# Patient Record
Sex: Male | Born: 1980 | Race: Black or African American | Hispanic: No | Marital: Married | Smoking: Never smoker
Health system: Southern US, Community
[De-identification: ages and names within clinical notes are randomized; demographics above are authoritative.]

## PROBLEM LIST (undated history)

## (undated) DIAGNOSIS — N200 Calculus of kidney: Secondary | ICD-10-CM

## (undated) HISTORY — PX: BACK SURGERY: SHX140

## (undated) HISTORY — PX: LITHOTRIPSY: SUR834

---

## 2005-03-14 ENCOUNTER — Emergency Department (HOSPITAL_COMMUNITY): Admission: EM | Admit: 2005-03-14 | Discharge: 2005-03-14 | Payer: Self-pay | Admitting: Emergency Medicine

## 2005-03-16 ENCOUNTER — Emergency Department (HOSPITAL_COMMUNITY): Admission: EM | Admit: 2005-03-16 | Discharge: 2005-03-16 | Payer: Self-pay | Admitting: Emergency Medicine

## 2008-08-25 ENCOUNTER — Emergency Department (HOSPITAL_BASED_OUTPATIENT_CLINIC_OR_DEPARTMENT_OTHER): Admission: EM | Admit: 2008-08-25 | Discharge: 2008-08-25 | Payer: Self-pay | Admitting: Emergency Medicine

## 2010-08-09 ENCOUNTER — Emergency Department (HOSPITAL_COMMUNITY): Admission: EM | Admit: 2010-08-09 | Discharge: 2010-08-09 | Payer: Self-pay | Admitting: Family Medicine

## 2010-08-12 ENCOUNTER — Emergency Department (HOSPITAL_COMMUNITY): Admission: EM | Admit: 2010-08-12 | Discharge: 2010-08-12 | Payer: Self-pay | Admitting: Emergency Medicine

## 2010-08-20 ENCOUNTER — Emergency Department (HOSPITAL_COMMUNITY): Admission: EM | Admit: 2010-08-20 | Discharge: 2010-08-20 | Payer: Self-pay | Admitting: Family Medicine

## 2010-11-24 ENCOUNTER — Encounter: Payer: Self-pay | Admitting: Urology

## 2011-01-16 LAB — CULTURE, ROUTINE-ABSCESS

## 2015-07-27 ENCOUNTER — Emergency Department (HOSPITAL_BASED_OUTPATIENT_CLINIC_OR_DEPARTMENT_OTHER): Payer: Self-pay

## 2015-07-27 ENCOUNTER — Encounter (HOSPITAL_BASED_OUTPATIENT_CLINIC_OR_DEPARTMENT_OTHER): Payer: Self-pay | Admitting: *Deleted

## 2015-07-27 ENCOUNTER — Emergency Department (HOSPITAL_BASED_OUTPATIENT_CLINIC_OR_DEPARTMENT_OTHER)
Admission: EM | Admit: 2015-07-27 | Discharge: 2015-07-27 | Disposition: A | Discharge status: Home / Self Care | Payer: Self-pay | Attending: Emergency Medicine | Admitting: Emergency Medicine

## 2015-07-27 DIAGNOSIS — R52 Pain, unspecified: Secondary | ICD-10-CM | POA: Insufficient documentation

## 2015-07-27 DIAGNOSIS — R05 Cough: Secondary | ICD-10-CM | POA: Insufficient documentation

## 2015-07-27 DIAGNOSIS — R69 Illness, unspecified: Secondary | ICD-10-CM

## 2015-07-27 DIAGNOSIS — J111 Influenza due to unidentified influenza virus with other respiratory manifestations: Secondary | ICD-10-CM

## 2015-07-27 DIAGNOSIS — J349 Unspecified disorder of nose and nasal sinuses: Secondary | ICD-10-CM | POA: Insufficient documentation

## 2015-07-27 DIAGNOSIS — Z87442 Personal history of urinary calculi: Secondary | ICD-10-CM | POA: Insufficient documentation

## 2015-07-27 HISTORY — DX: Calculus of kidney: N20.0

## 2015-07-27 LAB — BASIC METABOLIC PANEL
ANION GAP: 7 (ref 5–15)
BUN: 8 mg/dL (ref 6–20)
CHLORIDE: 103 mmol/L (ref 101–111)
CO2: 28 mmol/L (ref 22–32)
Calcium: 9.4 mg/dL (ref 8.9–10.3)
Creatinine, Ser: 1.01 mg/dL (ref 0.61–1.24)
GFR calc Af Amer: >60 mL/min (ref >60)
GLUCOSE: 92 mg/dL (ref 65–99)
POTASSIUM: 5.8 mmol/L — AB (ref 3.5–5.1)
SODIUM: 138 mmol/L (ref 135–145)

## 2015-07-27 LAB — CBC WITH DIFFERENTIAL/PLATELET
BASOS ABS: 0 10*3/uL (ref 0.0–0.1)
Basophils Relative: 0 %
Eosinophils Absolute: 0.1 10*3/uL (ref 0.0–0.7)
Eosinophils Relative: 1 %
HCT: 45.7 % (ref 39.0–52.0)
HEMOGLOBIN: 15.3 g/dL (ref 13.0–17.0)
LYMPHS ABS: 1.2 10*3/uL (ref 0.7–4.0)
LYMPHS PCT: 13 %
MCH: 30.4 pg (ref 26.0–34.0)
MCHC: 33.5 g/dL (ref 30.0–36.0)
MCV: 90.7 fL (ref 78.0–100.0)
Monocytes Absolute: 1.3 10*3/uL — ABNORMAL HIGH (ref 0.1–1.0)
Monocytes Relative: 14 %
NEUTROS PCT: 72 %
Neutro Abs: 6.7 10*3/uL (ref 1.7–7.7)
PLATELETS: 211 10*3/uL (ref 150–400)
RBC: 5.04 MIL/uL (ref 4.22–5.81)
RDW: 12.7 % (ref 11.5–15.5)
WBC: 9.4 10*3/uL (ref 4.0–10.5)

## 2015-07-27 LAB — I-STAT CG4 LACTIC ACID, ED: LACTIC ACID, VENOUS: 1.84 mmol/L (ref 0.5–2.0)

## 2015-07-27 LAB — INFLUENZA PANEL BY PCR (TYPE A & B)
H1N1FLUPCR: NOT DETECTED
INFLBPCR: NEGATIVE
Influenza A By PCR: NEGATIVE

## 2015-07-27 MED ORDER — ACETAMINOPHEN 325 MG PO TABS
650.0000 mg | ORAL_TABLET | Freq: Once | ORAL | Status: AC
  Administered 2015-07-27: 650 mg via ORAL
  Filled 2015-07-27: qty 2

## 2015-07-27 MED ORDER — HYDROCODONE-ACETAMINOPHEN 5-325 MG PO TABS
1.0000 | ORAL_TABLET | Freq: Once | ORAL | Status: AC
  Administered 2015-07-27: 1 via ORAL
  Filled 2015-07-27: qty 1

## 2015-07-27 MED ORDER — OSELTAMIVIR PHOSPHATE 75 MG PO CAPS
75.0000 mg | ORAL_CAPSULE | Freq: Once | ORAL | Status: AC
  Administered 2015-07-27: 75 mg via ORAL
  Filled 2015-07-27: qty 1

## 2015-07-27 MED ORDER — HYDROCODONE-ACETAMINOPHEN 5-325 MG PO TABS: ORAL_TABLET | ORAL | Status: DC

## 2015-07-27 MED ORDER — SODIUM CHLORIDE 0.9 % IV BOLUS (SEPSIS)
1000.0000 mL | Freq: Once | INTRAVENOUS | Status: AC
  Administered 2015-07-27: 1000 mL via INTRAVENOUS

## 2015-07-27 MED ORDER — IPRATROPIUM-ALBUTEROL 0.5-2.5 (3) MG/3ML IN SOLN
3.0000 mL | Freq: Four times a day (QID) | RESPIRATORY_TRACT | Status: DC
  Administered 2015-07-27: 3 mL via RESPIRATORY_TRACT
  Filled 2015-07-27: qty 3

## 2015-07-27 MED ORDER — PROMETHAZINE HCL 25 MG PO TABS: 25.0000 mg | ORAL_TABLET | Freq: Four times a day (QID) | ORAL | Status: DC | PRN

## 2015-07-27 MED ORDER — OSELTAMIVIR PHOSPHATE 75 MG PO CAPS: 75.0000 mg | ORAL_CAPSULE | Freq: Two times a day (BID) | ORAL | Status: DC

## 2015-07-27 NOTE — ED Notes (Signed)
Pt. Stated he has had chills and body aches for approx. 3 days.

## 2015-07-27 NOTE — Discharge Instructions (Signed)
Return to the emergency room for any worsening or concerning symptoms including fast breathing, heart racing, confusion, vomiting.  Rest, cover your mouth when you cough and wash your hands frequently.   Push fluids: water or Gatorade, do not drink any soda, juice or caffeinated beverages.  For fever and pain control you can take Motrin (ibuprofen) as follows: 400 mg (this is normally 2 over the counter pills) every 4 hours with food.  Do not return to work until a day after your fever breaks.   Take Vicodin for cough and pain control, do not drink alcohol, drive, care for children or do other critical tasks while taking Vicodin     Do not hesitate to return to the emergency room for any new, worsening or concerning symptoms.  Please obtain primary care using resource guide below. Let them know that you were seen in the emergency room and that they will need to obtain records for further outpatient management.    Emergency Department Resource Guide 1) Find a Doctor and Pay Out of Pocket Although you won't have to find out who is covered by your insurance plan, it is a good idea to ask around and get recommendations. You will then need to call the office and see if the doctor you have chosen will accept you as a new patient and what types of options they offer for patients who are self-pay. Some doctors offer discounts or will set up payment plans for their patients who do not have insurance, but you will need to ask so you aren't surprised when you get to your appointment.  2) Contact Your Local Health Department Not all health departments have doctors that can see patients for sick visits, but many do, so it is worth a call to see if yours does. If you don't know where your local health department is, you can check in your phone book. The CDC also has a tool to help you locate your state's health department, and many state websites also have listings of all of their local health  departments.  3) Find a Walk-in Clinic If your illness is not likely to be very severe or complicated, you may want to try a walk in clinic. These are popping up all over the country in pharmacies, drugstores, and shopping centers. They're usually staffed by nurse practitioners or physician assistants that have been trained to treat common illnesses and complaints. They're usually fairly quick and inexpensive. However, if you have serious medical issues or chronic medical problems, these are probably not your best option.  No Primary Care Doctor: - Call Health Connect at  [phone] - they can help you locate a primary care doctor that  accepts your insurance, provides certain services, etc. - Physician Referral Service- [phone]  Chronic Pain Problems: Organization         Address  Phone   Notes  [facility]  [phone] Patients need to be referred by their primary care doctor.   Medication Assistance: Organization         Address  Phone   Notes  [facility] [address] [place], [place] [address] [phone] --Must be a resident of [facility] -- Must have NO insurance coverage whatsoever (no Medicaid/ Medicare, etc.) -- The pt. MUST have a primary care doctor that directs their care regularly and follows them in the community   [facility]  [phone]   [address]  [phone]  Agencies that provide inexpensive medical care: °Organization         Address  Phone   Notes  °[facility]  (336) [phone]   °[facility]    (336) 832-7272   °[facility] [address] °[place], [place] [address] (336) 832-4777   °[facility] [address], °[place] (336) 271-4999   °[facility]    (336) 373-0678   °[facility]    (336) 272-1050   °[facility] ° [address], [place] Phone:  (336)  [phone], Fax:  (336) [phone] Hours of Operation:  9 am - 6 pm, M-F.  Also accepts Medicaid/Medicare and self-pay.  °[facility] ° [address], [place] Phone: (336) [phone], Fax: (336) [phone]. Hours of Operation:  8:30 am - 5:30 pm, M-F.  Also accepts Medicaid and self-pay.  °[facility] [place] [address], [place] Phone: (336) 878-6027   °[facility] [address], [place], [place] (336)723-1848, Ext. 123 Mondays & Thursdays: 7-9 AM.  First 15 patients are seen on a first come, first serve basis. °  ° °Medicaid-accepting [place] Providers: ° °Organization         Address  Phone   Notes  °[facility] [address], [place] (336) 641-2100 Also accepts self-pay patients.  °[facility] [address], [place] ° (336) 856-9996   °[facility] [address], [place] (336) 288-8857   °[facility] [address] [place] [address], [place] (336) 299-7000   °Veita Bland [address], [place]  ° (336) 373-1557 Only accepts [place] Access Medicaid patients after they have their name applied to their card.  ° °Self-Pay (no insurance) in [place]: ° °Organization         Address  Phone   Notes  °Sickle Cell Patients, [facility] [address], [place] (336) 832-1970   °[facility] [address], [place] (336) 832-4400   °[facility] ° [address], [place] (336) 992-4800   °[facility]/Dr. Osei-Bonsu ° [address] [place] [address], [place] or [address], [place] (336) 841-8500 Phone number for both [place] and [place] locations is the same.  °[facility] [address], [place] (336) 299-0000   °[facility] [address] [place] [address], [place] or [address] (336)  852-7530 °(336) 878-2260   °[facility] [address], [place] (336) 350-1642, phone; (336) 294-5005, fax Sees patients 1st and 3rd Saturday of every month.  Must not qualify for public or private insurance (i.e. Medicaid, Medicare, [place] Health Choice, Veterans' Benefits) • Household income should be no more than 200% of the poverty level •The clinic cannot treat you if you are pregnant or think you are pregnant • Sexually transmitted diseases are not treated at the clinic.  ° ° °Dental Care: °Organization         Address  Phone  Notes  °[place] [facility] [address], [place] (336) 641-6152 Accepts children up to age 21 who are enrolled in Medicaid or [place] Health Choice; pregnant women with a Medicaid card; and children who have applied for Medicaid or   [place] Health Choice, but were declined, whose parents can pay a reduced fee at time of service.  °[place] [facility] [place]  [address], [place] (336) 641-7733 Accepts children up to age 21 who are enrolled in Medicaid or [place] Health Choice; pregnant women with a Medicaid card; and children who have applied for Medicaid or [place] Health Choice, but were declined, whose parents can pay a reduced fee at time of service.  °[facility] ° [address], [place] (336) 641-4533 Patients are seen by appointment only. Walk-ins are not accepted. [facility] will see patients 18 years of age and older. °Monday - Tuesday (8am-5pm) °Most Wednesdays (8:30-5pm) °$30 per visit, cash only  °[facility] ° [address], [place] (336) 641-4533 Patients are seen by appointment only. Walk-ins are not accepted. [facility] will see patients 18 years of age and older. °One Wednesday Evening (Monthly: Volunteer Based).  $30 per visit, cash only  °[facility]  (919) 537-3737 for adults;  Children under age 4, call Graduate Pediatric Dentistry at ([phone]) [phone]. Children aged 4-14, please call ([phone]) [phone] to request a pediatric application. ° Dental services are provided in all areas of dental care including fillings, crowns and bridges, complete and partial dentures, implants, gum treatment, root canals, and extractions. Preventive care is also provided. Treatment is provided to both adults and children. °Patients are selected via a lottery and there is often a waiting list. °  °[facility] [address], °[place] ° (336) 763-8833 [email or web address] °  °[facility] [address], [place], [place] (336)723-1848, Ext. 123 Second and Fourth Thursday of each month, opens at 6:30 AM; Clinic ends at 9 AM.  Patients are seen on a first-come first-served basis, and a limited number are seen during each clinic.  ° °[facility] ° [address], [place], [place] (336) 723-7904   Eligibility Requirements °You must have lived in [place], [place], or [place] counties for at least the last three months. °  You cannot be eligible for state or federal sponsored healthcare insurance, including Veterans Administration, Medicaid, or Medicare. °  You generally cannot be eligible for healthcare insurance through your employer.  °  How to apply: °Eligibility screenings are held every Tuesday and Wednesday afternoon from 1:00 pm until 4:00 pm. You do not need an appointment for the interview!  °[facility] [address], [place], [place] 336-631-2330   °[facility]  336-342-8273   °[facility]  336-703-3100   °[facility]  336-570-6415   ° °Behavioral Health Resources in the Community: °Intensive Outpatient Programs °Organization         Address  Phone  Notes  °[facility] [address], [place], [place] 336-878-6098   °[facility] [address], [place], [place] 336-832-9800   °[facility] [address], [place], [place] ° 336-882-2125   °[facility] [address],  °[place], [place] 1-800-853-5163 or 336-641-4981   °Substance Abuse Resources °Organization         Address  Phone  Notes  °[facility]  336-882-2125   °[facility]  336-784-9470   °[facility]  336-285-9073   °[facility]  336-845-3988   °Residential & Outpatient Substance Abuse Program  1-800-659-3381   °  Psychological Services °Organization         Address  Phone  Notes  °[facility]  336- 832-9600   °[facility]  336- 378-7881   °[facility] [address], [place] 1-800-853-5163 or 336-641-4981   ° °Mobile Crisis Teams °Organization         Address  Phone  Notes  °[facility], Mobile Crisis Care Unit  1-877-626-1772   °Assertive °[facility] ° [address] [place], [place] 336-834-9664   °Sharon DeEsch [address] °[place] 336-554-5454   ° °Self-Help/Support Groups °Organization         Address  Phone             Notes  °[facility] [place] - variety of support groups  336- 373-1402 Call for more information  °Narcotics Anonymous (NA), [facility] [address], °[place]  2 meetings at this location  ° °Residential Treatment Programs °Organization         Address  Phone  Notes  °[facility] [address],    °[place]  1-866-801-8205   °[facility] ° [address], [place], [place] 704-293-8524   °[facility] [address], [place] 336-845-3988 Admissions: 8am-3pm M-F  °[facility] [address]    °[place], [place] 336-841-1104   °[facility] [address], [place], [place] 336-379-7146   °[facility] [address]  °[place], [place] 336-285-9073   °[facility] - Intensive  Outpatient [address], [place], [place] 336-852-3033   °[facility] [address]  °[place], [place] 1-877-615-2722 or 336-784-9470   °[facility] [address] [place], [place] 336-227-7417 Accepts Medicaid  °[facility] [address]  ° [place] 1-800-659-3381 Substance Abuse/Addiction Treatment  ° °[place] Behavioral Health Resources °Organization         Address  Phone  Notes  °[facility]  (888) 581-9988   °Julie Brannon, PhD [address] [place], [place]   (336) 349-5553 or (336) 951-0000   °[facility]   [address] °[place], [place] (336) 349-4454   °[facility] [address], [place], [place] (336) 342-8316 Insurance/Medicaid/sponsorship through [facility]  °[facility] [address]                                    [place], [place] (336) 342-8316 Therapy/tele-psych/case  °[facility] [address]  ° [place], [place] (336) 349-2233    °Dr. Arfeen  (336) 349-4544   °[facility] [place]  [employer] [place] [facility] 1) [address], [place] °2) [address], [place] °3)  [address], [place] (336) 349-3220 °(336) 342-7768 ° °(336) 342-8140   °[place] [facility] (336) 342-1394 or (336) 342-3537 (After Hours)    ° ° ° °

## 2015-07-27 NOTE — ED Notes (Signed)
Cough, runny nose, aching all over, headache, ear pain and sore throat x 3 days.

## 2015-07-27 NOTE — ED Provider Notes (Signed)
CSN: [id number]     Arrival date & time 07/27/15  1850 History  This chart was scribed for non-physician practitioner, Wynetta Emery, PA working with Cathren Laine, MD by Angelene Giovanni, ED Scribe. The patient was seen in room MH11/MH11 and the patient's care was started at 7:11 PM     Chief Complaint  Patient presents with  . URI   The history is provided by the patient. No language interpreter was used.   HPI Comments: Henry Fields is a 34 y.o. male who presents to the Emergency Department complaining of URI onset 3 days ago. He reports associated generalized aches, productive cough, rhinorrhea with thick green sputum, bilateral ear pain, sore throat, and a HA. He denies any CP but adds that when he lays down, he has chest pressure and SOB. He also denies any N/V/D. He reports that his highest fever was 100.3. He states that he alternated Advil and Benadryl with no relief. He denies getting his flu vaccine this season. He denies being a current smoker.  No PCP.   Past Medical History  Diagnosis Date  . Kidney stones    Past Surgical History  Procedure Laterality Date  . Lithotripsy    . Back surgery     No family history on file. Social History  Substance Use Topics  . Smoking status: Never Smoker   . Smokeless tobacco: None  . Alcohol Use: No    Review of Systems A complete 10 system review of systems was obtained and all systems are negative except as noted in the HPI and PMH.     Allergies  Review of patient's allergies indicates no known allergies.  Home Medications   Prior to Admission medications   Not on File   BP 105/72 mmHg  Pulse 103  Temp(Src) 100.3 F (37.9 C) (Oral)  Resp 18  Ht  (1.778 m)  Wt 120 lb (54.432 kg)  BMI 17.22 kg/m2  SpO2 97% Physical Exam  Constitutional: He is oriented to person, place, and time. He appears well-developed and well-nourished. No distress.  HENT:  Head: Normocephalic.  Mouth/Throat: Oropharynx is clear  and moist.  Positive rhinorrhea  Eyes: Conjunctivae and EOM are normal. Pupils are equal, round, and reactive to light. No scleral icterus.  Neck: Normal range of motion.  Cardiovascular: Normal rate, regular rhythm and intact distal pulses.   Pulmonary/Chest: Effort normal and breath sounds normal. No stridor. No respiratory distress. He has no wheezes. He has no rales. He exhibits no tenderness.  Abdominal: Soft. Bowel sounds are normal. He exhibits no distension and no mass. There is no tenderness. There is no rebound and no guarding.  Musculoskeletal: Normal range of motion.  Neurological: He is alert and oriented to person, place, and time.  Psychiatric: He has a normal mood and affect.  Nursing note and vitals reviewed.   ED Course  Procedures (including critical care time) DIAGNOSTIC STUDIES: Oxygen Saturation is 97% on RA, adequate by my interpretation.    COORDINATION OF CARE: 7:16 PM- Pt advised of plan for treatment and pt agrees.      Labs Review Labs Reviewed - No data to display  Imaging Review No results found. Wynetta Emery, PA has personally reviewed and evaluated these images and lab results as part of her medical decision-making.   EKG Interpretation None      MDM   Final diagnoses:  Influenza-like illness    Filed Vitals:   07/27/15 1901 07/27/15 2020  BP: 105/72  Pulse: 103   Temp: 100.3 F (37.9 C)   TempSrc: Oral   Resp: 18   Height:  (1.778 m)   Weight: 120 lb (54.432 kg)   SpO2: 97% 100%    Medications  acetaminophen (TYLENOL) tablet 650 mg (not administered)  ipratropium-albuterol (DUONEB) 0.5-2.5 (3) MG/3ML nebulizer solution 3 mL (not administered)  HYDROcodone-acetaminophen (NORCO/VICODIN) 5-325 MG per tablet 1 tablet (not administered)  sodium chloride 0.9 % bolus 1,000 mL (not administered)    Henry Fields is a pleasant 34 y.o. male presenting with cough, rhinorrhea, sore throat and diffuse myalgia. Low-grade  temperature and mild tachycardia. Chest x-ray without infiltrate. Patient reports shortness of breath, will give nebulizer treatment however, his lung sounds are clear with excellent air movement. Patient has no leukocytosis, normal lactic acid. Pending influenza testing.  I have been informed the influenza testing will not resolve tonight. Will treat clinically.  Evaluation does not show pathology that would require ongoing emergent intervention or inpatient treatment. Pt is hemodynamically stable and mentating appropriately. Discussed findings and plan with patient/guardian, who agrees with care plan. All questions answered. Return precautions discussed and outpatient follow up given.   New Prescriptions   HYDROCODONE-ACETAMINOPHEN (NORCO/VICODIN) 5-325 MG PER TABLET    Take 1-2 tablets by mouth every 6 hours as needed for pain and/or cough.   OSELTAMIVIR (TAMIFLU) 75 MG CAPSULE    Take 1 capsule (75 mg total) by mouth every 12 (twelve) hours.   PROMETHAZINE (PHENERGAN) 25 MG TABLET    Take 1 tablet (25 mg total) by mouth every 6 (six) hours as needed for nausea or vomiting.     I personally performed the services described in this documentation, which was scribed in my presence. The recorded information has been reviewed and is accurate.   Wynetta Emery, PA-C 07/27/15 2222  Cathren Laine, MD 07/27/15 [phone]

## 2016-04-02 ENCOUNTER — Emergency Department (HOSPITAL_BASED_OUTPATIENT_CLINIC_OR_DEPARTMENT_OTHER)
Admission: EM | Admit: 2016-04-02 | Discharge: 2016-04-03 | Disposition: A | Discharge status: Home / Self Care | Payer: BLUE CROSS/BLUE SHIELD | Attending: Emergency Medicine | Admitting: Emergency Medicine

## 2016-04-02 ENCOUNTER — Emergency Department (HOSPITAL_BASED_OUTPATIENT_CLINIC_OR_DEPARTMENT_OTHER): Payer: BLUE CROSS/BLUE SHIELD

## 2016-04-02 ENCOUNTER — Encounter (HOSPITAL_BASED_OUTPATIENT_CLINIC_OR_DEPARTMENT_OTHER): Payer: Self-pay | Admitting: *Deleted

## 2016-04-02 DIAGNOSIS — R0789 Other chest pain: Secondary | ICD-10-CM

## 2016-04-02 DIAGNOSIS — R072 Precordial pain: Secondary | ICD-10-CM | POA: Insufficient documentation

## 2016-04-02 LAB — CBC
HCT: 44.9 % (ref 39.0–52.0)
Hemoglobin: 15 g/dL (ref 13.0–17.0)
MCH: 30.7 pg (ref 26.0–34.0)
MCHC: 33.4 g/dL (ref 30.0–36.0)
MCV: 92 fL (ref 78.0–100.0)
PLATELETS: 243 10*3/uL (ref 150–400)
RBC: 4.88 MIL/uL (ref 4.22–5.81)
RDW: 13 % (ref 11.5–15.5)
WBC: 7 10*3/uL (ref 4.0–10.5)

## 2016-04-02 LAB — COMPREHENSIVE METABOLIC PANEL
ALT: 20 U/L (ref 17–63)
AST: 30 U/L (ref 15–41)
Albumin: 4.2 g/dL (ref 3.5–5.0)
Alkaline Phosphatase: 47 U/L (ref 38–126)
Anion gap: 5 (ref 5–15)
BUN: 18 mg/dL (ref 6–20)
CHLORIDE: 103 mmol/L (ref 101–111)
CO2: 30 mmol/L (ref 22–32)
CREATININE: 0.85 mg/dL (ref 0.61–1.24)
Calcium: 9.9 mg/dL (ref 8.9–10.3)
GFR calc non Af Amer: >60 mL/min (ref >60)
Glucose, Bld: 87 mg/dL (ref 65–99)
POTASSIUM: 3.9 mmol/L (ref 3.5–5.1)
SODIUM: 138 mmol/L (ref 135–145)
Total Bilirubin: 1.1 mg/dL (ref 0.3–1.2)
Total Protein: 7.2 g/dL (ref 6.5–8.1)

## 2016-04-02 LAB — TROPONIN I

## 2016-04-02 MED ORDER — IBUPROFEN 400 MG PO TABS
400.0000 mg | ORAL_TABLET | Freq: Once | ORAL | Status: AC
  Administered 2016-04-02: 400 mg via ORAL
  Filled 2016-04-02: qty 1

## 2016-04-02 MED ORDER — HYDROCODONE-ACETAMINOPHEN 5-325 MG PO TABS
2.0000 | ORAL_TABLET | Freq: Once | ORAL | Status: AC
  Administered 2016-04-02: 2 via ORAL
  Filled 2016-04-02: qty 2

## 2016-04-02 NOTE — ED Notes (Signed)
Patient transported to X-ray 

## 2016-04-02 NOTE — ED Notes (Signed)
MD at bedside to update pt on plan of care.

## 2016-04-02 NOTE — Discharge Instructions (Signed)
It was our pleasure to provide your ER care today - we hope that you feel better.  Take motrin or aleve as need for pain.  Follow up with primary care doctor in the next few days if symptoms fail to improve/resolve.  Return to ER if worse, new symptoms, trouble breathing, persistent/recurrent chest pain, other concern.     Chest Wall Pain Chest wall pain is pain in or around the bones and muscles of your chest. Sometimes, an injury causes this pain. Sometimes, the cause may not be known. This pain may take several weeks or longer to get better. HOME CARE INSTRUCTIONS  Pay attention to any changes in your symptoms. Take these actions to help with your pain:   Rest as told by your health care provider.   Avoid activities that cause pain. These include any activities that use your chest muscles or your abdominal and side muscles to lift heavy items.   If directed, apply ice to the painful area:  Put ice in a plastic bag.  Place a towel between your skin and the bag.  Leave the ice on for 20 minutes, 2-3 times per day.  Take over-the-counter and prescription medicines only as told by your health care provider.  Do not use tobacco products, including cigarettes, chewing tobacco, and e-cigarettes. If you need help quitting, ask your health care provider.  Keep all follow-up visits as told by your health care provider. This is important. SEEK MEDICAL CARE IF:  You have a fever.  Your chest pain becomes worse.  You have new symptoms. SEEK IMMEDIATE MEDICAL CARE IF:  You have nausea or vomiting.  You feel sweaty or light-headed.  You have a cough with phlegm (sputum) or you cough up blood.  You develop shortness of breath.   This information is not intended to replace advice given to you by your health care provider. Make sure you discuss any questions you have with your health care provider.   Document Released: 10/20/2005 Document Revised: 07/11/2015 Document Reviewed:  01/15/2015 Elsevier Interactive Patient Education 2016 Else[employer] Inc.    Nonspecific Chest Pain  Chest pain can be caused by many different conditions. There is always a chance that your pain could be related to something serious, such as a heart attack or a blood clot in your lungs. Chest pain can also be caused by conditions that are not life-threatening. If you have chest pain, it is very important to follow up with your health care provider. CAUSES  Chest pain can be caused by:  Heartburn.  Pneumonia or bronchitis.  Anxiety or stress.  Inflammation around your heart (pericarditis) or lung (pleuritis or pleurisy).  A blood clot in your lung.  A collapsed lung (pneumothorax). It can develop suddenly on its own (spontaneous pneumothorax) or from trauma to the chest.  Shingles infection (varicella-zoster virus).  Heart attack.  Damage to the bones, muscles, and cartilage that make up your chest wall. This can include:  Bruised bones due to injury.  Strained muscles or cartilage due to frequent or repeated coughing or overwork.  Fracture to one or more ribs.  Sore cartilage due to inflammation (costochondritis). RISK FACTORS  Risk factors for chest pain may include:  Activities that increase your risk for trauma or injury to your chest.  Respiratory infections or conditions that cause frequent coughing.  Medical conditions or overeating that can cause heartburn.  Heart disease or family history of heart disease.  Conditions or health behaviors that increase your risk  of developing a blood clot.  Having had chicken pox (varicella zoster). SIGNS AND SYMPTOMS Chest pain can feel like:  Burning or tingling on the surface of your chest or deep in your chest.  Crushing, pressure, aching, or squeezing pain.  Dull or sharp pain that is worse when you move, cough, or take a deep breath.  Pain that is also felt in your back, neck, shoulder, or arm, or pain that spreads  to any of these areas. Your chest pain may come and go, or it may stay constant. DIAGNOSIS Lab tests or other studies may be needed to find the cause of your pain. Your health care provider may have you take a test called an ambulatory ECG (electrocardiogram). An ECG records your heartbeat patterns at the time the test is performed. You may also have other tests, such as:  Transthoracic echocardiogram (TTE). During echocardiography, sound waves are used to create a picture of all of the heart structures and to look at how blood flows through your heart.  Transesophageal echocardiogram (TEE).This is a more advanced imaging test that obtains images from inside your body. It allows your health care provider to see your heart in finer detail.  Cardiac monitoring. This allows your health care provider to monitor your heart rate and rhythm in real time.  Holter monitor. This is a portable device that records your heartbeat and can help to diagnose abnormal heartbeats. It allows your health care provider to track your heart activity for several days, if needed.  Stress tests. These can be done through exercise or by taking medicine that makes your heart beat more quickly.  Blood tests.  Imaging tests. TREATMENT  Your treatment depends on what is causing your chest pain. Treatment may include:  Medicines. These may include:  Acid blockers for heartburn.  Anti-inflammatory medicine.  Pain medicine for inflammatory conditions.  Antibiotic medicine, if an infection is present.  Medicines to dissolve blood clots.  Medicines to treat coronary artery disease.  Supportive care for conditions that do not require medicines. This may include:  Resting.  Applying heat or cold packs to injured areas.  Limiting activities until pain decreases. HOME CARE INSTRUCTIONS  If you were prescribed an antibiotic medicine, finish it all even if you start to feel better.  Avoid any activities that  bring on chest pain.  Do not use any tobacco products, including cigarettes, chewing tobacco, or electronic cigarettes. If you need help quitting, ask your health care provider.  Do not drink alcohol.  Take medicines only as directed by your health care provider.  Keep all follow-up visits as directed by your health care provider. This is important. This includes any further testing if your chest pain does not go away.  If heartburn is the cause for your chest pain, you may be told to keep your head raised (elevated) while sleeping. This reduces the chance that acid will go from your stomach into your esophagus.  Make lifestyle changes as directed by your health care provider. These may include:  Getting regular exercise. Ask your health care provider to suggest some activities that are safe for you.  Eating a heart-healthy diet. A registered dietitian can help you to learn healthy eating options.  Maintaining a healthy weight.  Managing diabetes, if necessary.  Reducing stress. SEEK MEDICAL CARE IF:  Your chest pain does not go away after treatment.  You have a rash with blisters on your chest.  You have a fever. SEEK IMMEDIATE MEDICAL CARE  IF:   Your chest pain is worse.  You have an increasing cough, or you cough up blood.  You have severe abdominal pain.  You have severe weakness.  You faint.  You have chills.  You have sudden, unexplained chest discomfort.  You have sudden, unexplained discomfort in your arms, back, neck, or jaw.  You have shortness of breath at any time.  You suddenly start to sweat, or your skin gets clammy.  You feel nauseous or you vomit.  You suddenly feel light-headed or dizzy.  Your heart begins to beat quickly, or it feels like it is skipping beats. These symptoms may represent a serious problem that is an emergency. Do not wait to see if the symptoms will go away. Get medical help right away. Call your local emergency services  (911 in the [place] Do not drive yourself to the hospital.   This information is not intended to replace advice given to you by your health care provider. Make sure you discuss any questions you have with your health care provider.   Document Released: 07/30/2005 Document Revised: 11/10/2014 Document Reviewed: 05/26/2014 Elsevier Interactive Patient Education [employer]2016 Elsevier Inc.

## 2016-04-02 NOTE — ED Provider Notes (Signed)
CSN: [id number]     Arrival date & time 04/02/16  1959 History   By signing my name below, I, Jasmyn B. Alexander, attest that this documentation has been prepared under the direction and in the presence of Cathren LaineKevin Raissa Dam, MD.  Electronically Signed: Gillis EndsJasmyn B. Lyn HollingsheadAlexander, ED Scribe. 04/02/2016. 9:58 PM.  Chief Complaint  Patient presents with  . Chest Pain   The history is provided by the patient. No language interpreter was used.   HPI Comments: Gwyndolyn Saxonimothy J Fines is a 35 y.o. male who presents to the Emergency Department complaining of sudden onset, intermittent, central chest pain onset x 9 hours ago. 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 He denies any heavy lifting when pain occurred. The pain is exacerbated with movement. Pt reports he took a baby aspirin earlier PTA with mild relief. He does not have any cardiac PMHx but does have a FHx of cardiac issues in his grandfather, but no family hx premature cad.   Denies any abdominal pain, cough, fever. Pt is non smoker, no drug use. No pleuritic pain or lateralizing pain. No leg pain or swelling. No hx dvt or pe.  No recent surgery, immobility, travel or trauma.       Past Medical History  Diagnosis Date  . Kidney stones    Past Surgical History  Procedure Laterality Date  . Lithotripsy    . Back surgery     History reviewed. No pertinent family history. Social History  Substance Use Topics  . Smoking status: Never Smoker   . Smokeless tobacco: None  . Alcohol Use: No    Review of Systems  Constitutional: Negative for fever.  HENT: Negative for sore throat.   Respiratory: Negative for cough and shortness of breath.   Cardiovascular: Positive for chest pain. Negative for palpitations and leg swelling.  Gastrointestinal: Negative for nausea, vomiting and abdominal pain.  Genitourinary: Negative for flank pain.  Musculoskeletal: Negative for back pain and neck pain.   Neurological: Negative for headaches.  All other systems reviewed and are negative.  Allergies  Review of patient's allergies indicates no known allergies.  Home Medications   Prior to Admission medications   Not on File   BP 104/81 mmHg  Pulse 56  Temp(Src) 99 F (37.2 C)  Resp 17  Ht 5\' 10"  (1.778 m)  Wt 120 lb (54.432 kg)  BMI 17.22 kg/m2  SpO2 100% Physical Exam  Constitutional: He is oriented to person, place, and time. He appears well-developed and well-nourished. No distress.  HENT:  Head: Atraumatic.  Mouth/Throat: Oropharynx is clear and moist.  Eyes: Conjunctivae are normal. No scleral icterus.  Neck: Neck supple. No tracheal deviation present.  Cardiovascular: Normal rate, regular rhythm, normal heart sounds and intact distal pulses.  Exam reveals no gallop and no friction rub.   No murmur heard. Pulmonary/Chest: Effort normal and breath sounds normal. No respiratory distress. He has no wheezes. He has no rales. He exhibits tenderness.  Chest wall tenderness, reproducing symptoms.   Abdominal: Soft. Bowel sounds are normal. He exhibits no distension. There is no tenderness. There is no rebound and no guarding.  Musculoskeletal: Normal range of motion. He exhibits no edema or tenderness.  Neurological: He is alert and oriented to person, place, and time.  Skin: Skin is warm and dry. No rash noted. He is not diaphoretic.  Psychiatric: He has a normal mood and affect.  Nursing  note and vitals reviewed.   ED Course  Procedures (including critical care time) DIAGNOSTIC STUDIES: Oxygen Saturation is 100% on RA, normal by my interpretation.    COORDINATION OF CARE: 9:07 PM-Discussed treatment plan which includes repeat troponin with pt at bedside and pt agreed to plan.    Labs Review  Results for orders placed or performed during the hospital encounter of 04/02/16  CBC  Result Value Ref Range   WBC 7.0 4.0 - 10.5 K/uL   RBC 4.88 4.22 - 5.81 MIL/uL    Hemoglobin 15.0 13.0 - 17.0 g/dL   HCT 16.1 09.6 - 04.5 %   MCV 92.0 78.0 - 100.0 fL   MCH 30.7 26.0 - 34.0 pg   MCHC 33.4 30.0 - 36.0 g/dL   RDW 40.9 81.1 - 91.4 %   Platelets 243 150 - 400 K/uL  Comprehensive metabolic panel  Result Value Ref Range   Sodium 138 135 - 145 mmol/L   Potassium 3.9 3.5 - 5.1 mmol/L   Chloride 103 101 - 111 mmol/L   CO2 30 22 - 32 mmol/L   Glucose, Bld 87 65 - 99 mg/dL   BUN 18 6 - 20 mg/dL   Creatinine, Ser 7.82 0.61 - 1.24 mg/dL   Calcium 9.9 8.9 - 95.6 mg/dL   Total Protein 7.2 6.5 - 8.1 g/dL   Albumin 4.2 3.5 - 5.0 g/dL   AST 30 15 - 41 U/L   ALT 20 17 - 63 U/L   Alkaline Phosphatase 47 38 - 126 U/L   Total Bilirubin 1.1 0.3 - 1.2 mg/dL   GFR calc non Af Amer >60 >60 mL/min   GFR calc Af Amer >60 >60 mL/min   Anion gap 5 5 - 15  Troponin I  Result Value Ref Range   Troponin I <0.03 <0.031 ng/mL   Dg Chest 2 View  04/02/2016  CLINICAL DATA:  Left-sided chest pain with shortness of breath, fatigue EXAM: CHEST  2 VIEW COMPARISON:  07/27/2015 FINDINGS: The heart size and vascular pattern are normal. There is no consolidation or effusion. Spinal rods stable. IMPRESSION: No active cardiopulmonary disease. Electronically Signed   By: Esperanza Heir M.D.   On: 04/02/2016 21:19         I have personally reviewed and evaluated these images and lab results as part of my medical decision-making.   EKG Interpretation   Date/Time:  Wednesday Apr 02 2016 20:05:32 EDT Ventricular Rate:  75 PR Interval:  156 QRS Duration: 78 QT Interval:  340 QTC Calculation: 379 R Axis:   80 Text Interpretation:  Normal sinus rhythm with sinus arrhythmia Possible  Left atrial enlargement Early repolarization `prom t waves No previous  tracing Confirmed by Denton Lank  MD, Caryn Bee ([id number]) on 04/02/2016 8:13:55 PM      MDM   I personally performed the services described in this documentation, which was scribed in my presence. The recorded information has been  reviewed and considered. Cathren Laine, MD  Iv ns. Labs.  Cxr.  Patient w chest wall tenderness.  Motrin po.  Recheck, pt comfortable appearing.  2325 repeat troponin pending.  Signed out to Dr Read Drivers to check repeat troponin when back.  If repeat trop normal/not increasing, feel likely will be able to be d/c with outpatient follow up.      Cathren Laine, MD 04/02/16 2330

## 2016-04-02 NOTE — ED Notes (Signed)
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 Pt states the pain now comes and goes. Pt describes it as a "tight pain" and rates it a 9/10. Pt denies ShOB, nausea, or diaphoresis. Pain is reproducible to palpation and worse with movement.

## 2016-04-02 NOTE — ED Notes (Signed)
Pt c/o chest pain x 1 day SOB only

## 2016-11-12 ENCOUNTER — Encounter (HOSPITAL_BASED_OUTPATIENT_CLINIC_OR_DEPARTMENT_OTHER): Payer: Self-pay | Admitting: *Deleted

## 2016-11-12 ENCOUNTER — Emergency Department (HOSPITAL_BASED_OUTPATIENT_CLINIC_OR_DEPARTMENT_OTHER)
Admission: EM | Admit: 2016-11-12 | Discharge: 2016-11-12 | Disposition: A | Discharge status: Home / Self Care | Payer: BLUE CROSS/BLUE SHIELD | Attending: Emergency Medicine | Admitting: Emergency Medicine

## 2016-11-12 DIAGNOSIS — R69 Illness, unspecified: Secondary | ICD-10-CM

## 2016-11-12 DIAGNOSIS — B349 Viral infection, unspecified: Secondary | ICD-10-CM

## 2016-11-12 DIAGNOSIS — J111 Influenza due to unidentified influenza virus with other respiratory manifestations: Secondary | ICD-10-CM

## 2016-11-12 DIAGNOSIS — R509 Fever, unspecified: Secondary | ICD-10-CM | POA: Diagnosis present

## 2016-11-12 LAB — CBC WITH DIFFERENTIAL/PLATELET
BASOS ABS: 0 10*3/uL (ref 0.0–0.1)
BASOS PCT: 1 %
EOS ABS: 0.1 10*3/uL (ref 0.0–0.7)
Eosinophils Relative: 1 %
HEMATOCRIT: 42.6 % (ref 39.0–52.0)
HEMOGLOBIN: 14.2 g/dL (ref 13.0–17.0)
Lymphocytes Relative: 15 %
Lymphs Abs: 1 10*3/uL (ref 0.7–4.0)
MCH: 30.9 pg (ref 26.0–34.0)
MCHC: 33.3 g/dL (ref 30.0–36.0)
MCV: 92.8 fL (ref 78.0–100.0)
Monocytes Absolute: 1.3 10*3/uL — ABNORMAL HIGH (ref 0.1–1.0)
Monocytes Relative: 19 %
NEUTROS ABS: 4.4 10*3/uL (ref 1.7–7.7)
NEUTROS PCT: 64 %
Platelets: 225 10*3/uL (ref 150–400)
RBC: 4.59 MIL/uL (ref 4.22–5.81)
RDW: 12.8 % (ref 11.5–15.5)
WBC: 6.7 10*3/uL (ref 4.0–10.5)

## 2016-11-12 LAB — BASIC METABOLIC PANEL
ANION GAP: 5 (ref 5–15)
BUN: 7 mg/dL (ref 6–20)
CHLORIDE: 103 mmol/L (ref 101–111)
CO2: 31 mmol/L (ref 22–32)
CREATININE: 0.93 mg/dL (ref 0.61–1.24)
Calcium: 9.2 mg/dL (ref 8.9–10.3)
GFR calc non Af Amer: >60 mL/min (ref >60)
Glucose, Bld: 90 mg/dL (ref 65–99)
Potassium: 4.2 mmol/L (ref 3.5–5.1)
SODIUM: 139 mmol/L (ref 135–145)

## 2016-11-12 LAB — URINALYSIS, ROUTINE W REFLEX MICROSCOPIC
BILIRUBIN URINE: NEGATIVE
Glucose, UA: NEGATIVE mg/dL
Hgb urine dipstick: NEGATIVE
KETONES UR: NEGATIVE mg/dL
LEUKOCYTES UA: NEGATIVE
NITRITE: NEGATIVE
Protein, ur: NEGATIVE mg/dL
SPECIFIC GRAVITY, URINE: 1.011 (ref 1.005–1.030)
pH: 7.5 (ref 5.0–8.0)

## 2016-11-12 MED ORDER — KETOROLAC TROMETHAMINE 30 MG/ML IJ SOLN
30.0000 mg | Freq: Once | INTRAMUSCULAR | Status: AC
  Administered 2016-11-12: 30 mg via INTRAVENOUS
  Filled 2016-11-12: qty 1

## 2016-11-12 MED ORDER — ACETAMINOPHEN 500 MG PO TABS
1000.0000 mg | ORAL_TABLET | Freq: Once | ORAL | Status: AC
  Administered 2016-11-12: 1000 mg via ORAL

## 2016-11-12 MED ORDER — SODIUM CHLORIDE 0.9 % IV BOLUS (SEPSIS)
1000.0000 mL | Freq: Once | INTRAVENOUS | Status: AC
  Administered 2016-11-12: 1000 mL via INTRAVENOUS

## 2016-11-12 MED ORDER — ACETAMINOPHEN 500 MG PO TABS
ORAL_TABLET | ORAL | Status: AC
  Filled 2016-11-12: qty 2

## 2016-11-12 NOTE — ED Provider Notes (Signed)
[facility]   CSN: [id number] Arrival date & time: 11/12/16  1701  By signing my name below, I, Doreatha Martin, attest that this documentation has been prepared under the direction and in the presence of Marily Memos, MD. Electronically Signed: Doreatha Martin, ED Scribe. 11/12/16. 6:28 PM.     History   Chief Complaint Chief Complaint  Patient presents with  . Influenza    HPI Henry Fields is a 36 y.o. male otherwise healthy who presents to the Emergency Department complaining of moderate generalized body aches x2 days. Pt reports associated chills, fever, decreased appetite, generalized weakness, HA, productive cough, nausea, watery diarrhea, lower back pain, generalized abdominal pain. He reports his HA is worsened with light and touch. 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 Pt reports recent sick contact with children who have recently had similar symptoms; no known flu dx. Pt has not been evaluated by a physician for their symptoms prior to today. He did not receive a flu shot this year. No recent travel outside the country.  He denies vomiting, ear pain.   The history is provided by the patient. No language interpreter was used.    Past Medical History:  Diagnosis Date  . Kidney stones     There are no active problems to display for this patient.   Past Surgical History:  Procedure Laterality Date  . BACK SURGERY    . LITHOTRIPSY         Home Medications    Prior to Admission medications   Not on File    Family History History reviewed. No pertinent family history.  Social History Social History  Substance Use Topics  . Smoking status: Never Smoker  . Smokeless tobacco: Not on file  . Alcohol use No     Allergies   Patient has no known allergies.   Review of Systems Review of Systems  Constitutional: Positive for appetite change, chills and fever.  HENT: Negative for ear pain.   Respiratory: Positive  for cough.   Gastrointestinal: Positive for abdominal pain, diarrhea and nausea. Negative for vomiting.  Musculoskeletal: Positive for back pain.  Neurological: Positive for weakness and headaches.  All other systems reviewed and are negative.    Physical Exam Updated Vital Signs BP 114/59 (BP Location: Right Arm)   Pulse 89   Temp 98.6 F (37 C)   Resp 16   SpO2 100%   Physical Exam  Constitutional: He appears well-developed and well-nourished.  Poor effort on exam.  HENT:  Head: Normocephalic.  Mildly erythematous PO.   Eyes: Conjunctivae and EOM are normal. Pupils are equal, round, and reactive to light.  Neck:  Tenderness in bilateral cervical paraspinal musculature.  Cardiovascular: Normal rate, regular rhythm and normal heart sounds.   Tachycardia recorded in triage has improved on exam.   Pulmonary/Chest: Effort normal. No respiratory distress. He has no wheezes. He has no rales.  Mildly decreased breath sounds on the right.  Better effort on left than right.   Abdominal: Soft. He exhibits no distension and no mass. There is tenderness. There is no rebound and no guarding.  Diffuse abdominal tenderness but no rebound, masses, guarding or peritonitis.   Musculoskeletal: Normal range of motion. He exhibits tenderness.  Paraspinal tenderness throughout C T and L musculature. Poor effort on exam.  Neurological: He is alert.  Poor effort on exam. 1+ patellar reflexes. Dorsiflexion and plantar flexion good once cooperative.   Skin: Skin  is warm and dry. No rash noted.  Psychiatric: He has a normal mood and affect. His behavior is normal.  Nursing note and vitals reviewed.    ED Treatments / Results   DIAGNOSTIC STUDIES: Oxygen Saturation is 100% on RA, normal by my interpretation.    COORDINATION OF CARE: 6:22 PM Discussed treatment plan with pt at bedside and pt agreed to plan.    Labs (all labs ordered are listed, but only abnormal results are displayed) Labs  Reviewed  CBC WITH DIFFERENTIAL/PLATELET - Abnormal; Notable for the following:       Result Value   Monocytes Absolute 1.3 (*)    All other components within normal limits  URINALYSIS, ROUTINE W REFLEX MICROSCOPIC - Abnormal; Notable for the following:    APPearance CLOUDY (*)    All other components within normal limits  BASIC METABOLIC PANEL    EKG  EKG Interpretation None       Radiology No results found.  Procedures Procedures (including critical care time)  Medications Ordered in ED Medications  acetaminophen (TYLENOL) 500 MG tablet (not administered)  acetaminophen (TYLENOL) tablet 1,000 mg (1,000 mg Oral Given 11/12/16 1721)  sodium chloride 0.9 % bolus 1,000 mL (0 mLs Intravenous Stopped 11/12/16 1925)  ketorolac (TORADOL) 30 MG/ML injection 30 mg (30 mg Intravenous Given 11/12/16 1846)     Initial Impression / Assessment and Plan / ED Course  I have reviewed the triage vital signs and the nursing notes.  Pertinent labs & imaging results that were available during my care of the patient were reviewed by me and considered in my medical decision making (see chart for details).  Clinical Course     Likely viral illness. Doubt sbi at this time. Exam benign. VS improved. Will suggest symptomatic treatment, pcp follow up.  Final Clinical Impressions(s) / ED Diagnoses   Final diagnoses:  Viral syndrome  Influenza-like illness    New Prescriptions New Prescriptions   No medications on file    I personally performed the services described in this documentation, which was scribed in my presence. The recorded information has been reviewed and is accurate.     Marily MemosJason Genisis Sonnier, MD 11/12/16 2023

## 2016-11-12 NOTE — ED Triage Notes (Signed)
Pt c/o flu like symptoms with n/v/d x 2 days

## 2016-11-27 ENCOUNTER — Emergency Department (HOSPITAL_BASED_OUTPATIENT_CLINIC_OR_DEPARTMENT_OTHER)
Admission: EM | Admit: 2016-11-27 | Discharge: 2016-11-27 | Disposition: A | Discharge status: Home / Self Care | Payer: BLUE CROSS/BLUE SHIELD | Attending: Emergency Medicine | Admitting: Emergency Medicine

## 2016-11-27 ENCOUNTER — Emergency Department (HOSPITAL_BASED_OUTPATIENT_CLINIC_OR_DEPARTMENT_OTHER): Payer: BLUE CROSS/BLUE SHIELD

## 2016-11-27 ENCOUNTER — Encounter (HOSPITAL_BASED_OUTPATIENT_CLINIC_OR_DEPARTMENT_OTHER): Payer: Self-pay | Admitting: *Deleted

## 2016-11-27 DIAGNOSIS — R509 Fever, unspecified: Secondary | ICD-10-CM | POA: Diagnosis not present

## 2016-11-27 DIAGNOSIS — R5383 Other fatigue: Secondary | ICD-10-CM | POA: Insufficient documentation

## 2016-11-27 DIAGNOSIS — R0981 Nasal congestion: Secondary | ICD-10-CM | POA: Insufficient documentation

## 2016-11-27 DIAGNOSIS — R05 Cough: Secondary | ICD-10-CM | POA: Diagnosis present

## 2016-11-27 DIAGNOSIS — M791 Myalgia: Secondary | ICD-10-CM | POA: Insufficient documentation

## 2016-11-27 DIAGNOSIS — R110 Nausea: Secondary | ICD-10-CM | POA: Insufficient documentation

## 2016-11-27 DIAGNOSIS — R6889 Other general symptoms and signs: Secondary | ICD-10-CM

## 2016-11-27 LAB — CBC WITH DIFFERENTIAL/PLATELET
BASOS ABS: 0 10*3/uL (ref 0.0–0.1)
BASOS PCT: 0 %
EOS ABS: 0.1 10*3/uL (ref 0.0–0.7)
Eosinophils Relative: 1 %
HCT: 43 % (ref 39.0–52.0)
Hemoglobin: 14.6 g/dL (ref 13.0–17.0)
Lymphocytes Relative: 11 %
Lymphs Abs: 0.9 10*3/uL (ref 0.7–4.0)
MCH: 30.7 pg (ref 26.0–34.0)
MCHC: 34 g/dL (ref 30.0–36.0)
MCV: 90.3 fL (ref 78.0–100.0)
Monocytes Absolute: 1.4 10*3/uL — ABNORMAL HIGH (ref 0.1–1.0)
Monocytes Relative: 18 %
NEUTROS PCT: 70 %
Neutro Abs: 5.4 10*3/uL (ref 1.7–7.7)
Platelets: 321 10*3/uL (ref 150–400)
RBC: 4.76 MIL/uL (ref 4.22–5.81)
RDW: 12.7 % (ref 11.5–15.5)
WBC: 7.8 10*3/uL (ref 4.0–10.5)

## 2016-11-27 LAB — BASIC METABOLIC PANEL
ANION GAP: 7 (ref 5–15)
BUN: 8 mg/dL (ref 6–20)
CALCIUM: 9.7 mg/dL (ref 8.9–10.3)
CHLORIDE: 102 mmol/L (ref 101–111)
CO2: 27 mmol/L (ref 22–32)
CREATININE: 0.98 mg/dL (ref 0.61–1.24)
GFR calc non Af Amer: >60 mL/min (ref >60)
Glucose, Bld: 91 mg/dL (ref 65–99)
Potassium: 3.5 mmol/L (ref 3.5–5.1)
SODIUM: 136 mmol/L (ref 135–145)

## 2016-11-27 MED ORDER — DM-GUAIFENESIN ER 30-600 MG PO TB12: 1.0000 | ORAL_TABLET | Freq: Two times a day (BID) | ORAL | 1 refills | Status: AC

## 2016-11-27 MED ORDER — SODIUM CHLORIDE 0.9 % IV SOLN: INTRAVENOUS | Status: DC

## 2016-11-27 MED ORDER — NAPROXEN 500 MG PO TABS: 500.0000 mg | ORAL_TABLET | Freq: Two times a day (BID) | ORAL | 0 refills | Status: DC

## 2016-11-27 MED ORDER — ONDANSETRON HCL 4 MG/2ML IJ SOLN
4.0000 mg | Freq: Once | INTRAMUSCULAR | Status: AC
  Administered 2016-11-27: 4 mg via INTRAVENOUS
  Filled 2016-11-27: qty 2

## 2016-11-27 MED ORDER — SODIUM CHLORIDE 0.9 % IV BOLUS (SEPSIS)
1000.0000 mL | Freq: Once | INTRAVENOUS | Status: AC
Start: 2016-11-27 — End: 2016-11-27
  Administered 2016-11-27: 1000 mL via INTRAVENOUS

## 2016-11-27 MED ORDER — ACETAMINOPHEN 325 MG PO TABS
650.0000 mg | ORAL_TABLET | Freq: Once | ORAL | Status: AC
  Administered 2016-11-27: 650 mg via ORAL
  Filled 2016-11-27: qty 2

## 2016-11-27 MED ORDER — KETOROLAC TROMETHAMINE 15 MG/ML IJ SOLN
15.0000 mg | Freq: Once | INTRAMUSCULAR | Status: AC
  Administered 2016-11-27: 15 mg via INTRAVENOUS
  Filled 2016-11-27: qty 1

## 2016-11-27 NOTE — ED Notes (Signed)
Hourly rounding performed. Pt comfortable at this time.

## 2016-11-27 NOTE — ED Provider Notes (Signed)
[facility] Provider Note   CSN: [id number] Arrival date & time: 11/27/16  1544     History   Chief Complaint Chief Complaint  Patient presents with  . Influenza    HPI Henry Fields is a 36 y.o. male.  The patient with several family members that are sick. Patient was sick 2 weeks ago which seemed to be an upper respiratory infection did improve from that. Then starting the yesterday started to feel bad again with cough bodyaches fever some nausea no vomiting no significant sore throat.      Past Medical History:  Diagnosis Date  . Kidney stones     There are no active problems to display for this patient.   Past Surgical History:  Procedure Laterality Date  . BACK SURGERY    . LITHOTRIPSY         Home Medications    Prior to Admission medications   Medication Sig Start Date End Date Taking? Authorizing Provider  dextromethorphan-guaiFENesin (MUCINEX DM) 30-600 MG 12hr tablet Take 1 tablet by mouth 2 (two) times daily. 11/27/16   Vanetta MuldersScott Silvio Sausedo, MD  naproxen (NAPROSYN) 500 MG tablet Take 1 tablet (500 mg total) by mouth 2 (two) times daily. 11/27/16   Vanetta MuldersScott Alorah Mcree, MD    Family History No family history on file.  Social History Social History  Substance Use Topics  . Smoking status: Never Smoker  . Smokeless tobacco: Never Used  . Alcohol use No     Allergies   Patient has no known allergies.   Review of Systems Review of Systems  Constitutional: Positive for fatigue and fever.  HENT: Positive for congestion.   Eyes: Negative for redness.  Respiratory: Positive for cough.   Gastrointestinal: Positive for nausea. Negative for abdominal pain and vomiting.  Genitourinary: Negative for dysuria.  Musculoskeletal: Positive for myalgias.  Skin: Negative for rash.  Neurological: Negative for headaches.  Hematological: Does not bruise/bleed easily.  Psychiatric/Behavioral: Negative for confusion.     Physical Exam Updated  Vital Signs BP 110/66 (BP Location: Left Arm)   Pulse 63   Temp 99 F (37.2 C) (Oral)   Resp 20   Ht 5\' 10"  (1.778 m)   Wt 57.6 kg   SpO2 100%   BMI 18.22 kg/m   Physical Exam  Constitutional: He is oriented to person, place, and time. He appears well-developed and well-nourished. No distress.  HENT:  Head: Normocephalic and atraumatic.  Mouth/Throat: Oropharynx is clear and moist.  Eyes: EOM are normal. Pupils are equal, round, and reactive to light.  Neck: Normal range of motion.  Cardiovascular: Normal rate, regular rhythm and normal heart sounds.   Pulmonary/Chest: Effort normal and breath sounds normal. No respiratory distress.  Abdominal: Soft. Bowel sounds are normal. There is no tenderness.  Musculoskeletal: Normal range of motion. He exhibits no edema.  Neurological: He is oriented to person, place, and time. No cranial nerve deficit or sensory deficit. He exhibits normal muscle tone. Coordination normal.  Skin: Skin is warm.  Vitals reviewed.    ED Treatments / Results  Labs (all labs ordered are listed, but only abnormal results are displayed) Labs Reviewed  CBC WITH DIFFERENTIAL/PLATELET - Abnormal; Notable for the following:       Result Value   Monocytes Absolute 1.4 (*)    All other components within normal limits  BASIC METABOLIC PANEL    EKG  EKG Interpretation None       Radiology Dg Chest 2 View  Result Date: 11/27/2016 CLINICAL DATA:  Subacute onset of cough, fever, body aches and chills. Initial encounter. EXAM: CHEST  2 VIEW COMPARISON:  Chest radiograph performed 04/02/2016 FINDINGS: The lungs are well-aerated and clear. There is no evidence of focal opacification, pleural effusion or pneumothorax. The heart is normal in size; the mediastinal contour is within normal limits. No acute osseous abnormalities are seen. Thoracolumbar spinal fusion hardware is noted. IMPRESSION: No acute cardiopulmonary process seen. Electronically Signed   By:  Roanna Raider M.D.   On: 11/27/2016 18:12    Procedures Procedures (including critical care time)  Medications Ordered in ED Medications  0.9 %  sodium chloride infusion (not administered)  acetaminophen (TYLENOL) tablet 650 mg (650 mg Oral Given 11/27/16 1557)  sodium chloride 0.9 % bolus 1,000 mL (1,000 mLs Intravenous New Bag/Given 11/27/16 1816)  ondansetron (ZOFRAN) injection 4 mg (4 mg Intravenous Given 11/27/16 1818)  ketorolac (TORADOL) 15 MG/ML injection 15 mg (15 mg Intravenous Given 11/27/16 1818)     Initial Impression / Assessment and Plan / ED Course  I have reviewed the triage vital signs and the nursing notes.  Pertinent labs & imaging results that were available during my care of the patient were reviewed by me and considered in my medical decision making (see chart for details).     Patient symptoms consistent with flulike illness. Nontoxic no acute distress. Chest x-ray negative for pneumonia. Labs without significant abnormalities. Patient is actually been sick for a couple weeks but got worse a couple days ago. Will treat symptomatically.  Final Clinical Impressions(s) / ED Diagnoses   Final diagnoses:  Flu-like symptoms    New Prescriptions New Prescriptions   DEXTROMETHORPHAN-GUAIFENESIN (MUCINEX DM) 30-600 MG 12HR TABLET    Take 1 tablet by mouth 2 (two) times daily.   NAPROXEN (NAPROSYN) 500 MG TABLET    Take 1 tablet (500 mg total) by mouth 2 (two) times daily.     Vanetta Mulders, MD 11/27/16 [phone]

## 2016-11-27 NOTE — ED Notes (Signed)
Pt. Over to radiology.  RN in to start pt. IV and get blood & give meds.  Unable to do due to pt. In radiology.

## 2016-11-27 NOTE — ED Triage Notes (Signed)
Flu symptoms x 2 weeks. He felt better then the cough came back yesterday. Fever today. Body aches, chills.

## 2017-05-11 IMAGING — DX DG CHEST 2V
2 series · 2 of 2 positions shown · non-contrast
Comparison: 07/27/2015

CLINICAL DATA: Left-sided chest pain with shortness of breath,
fatigue

EXAM:
CHEST  2 VIEW

[chest pa]
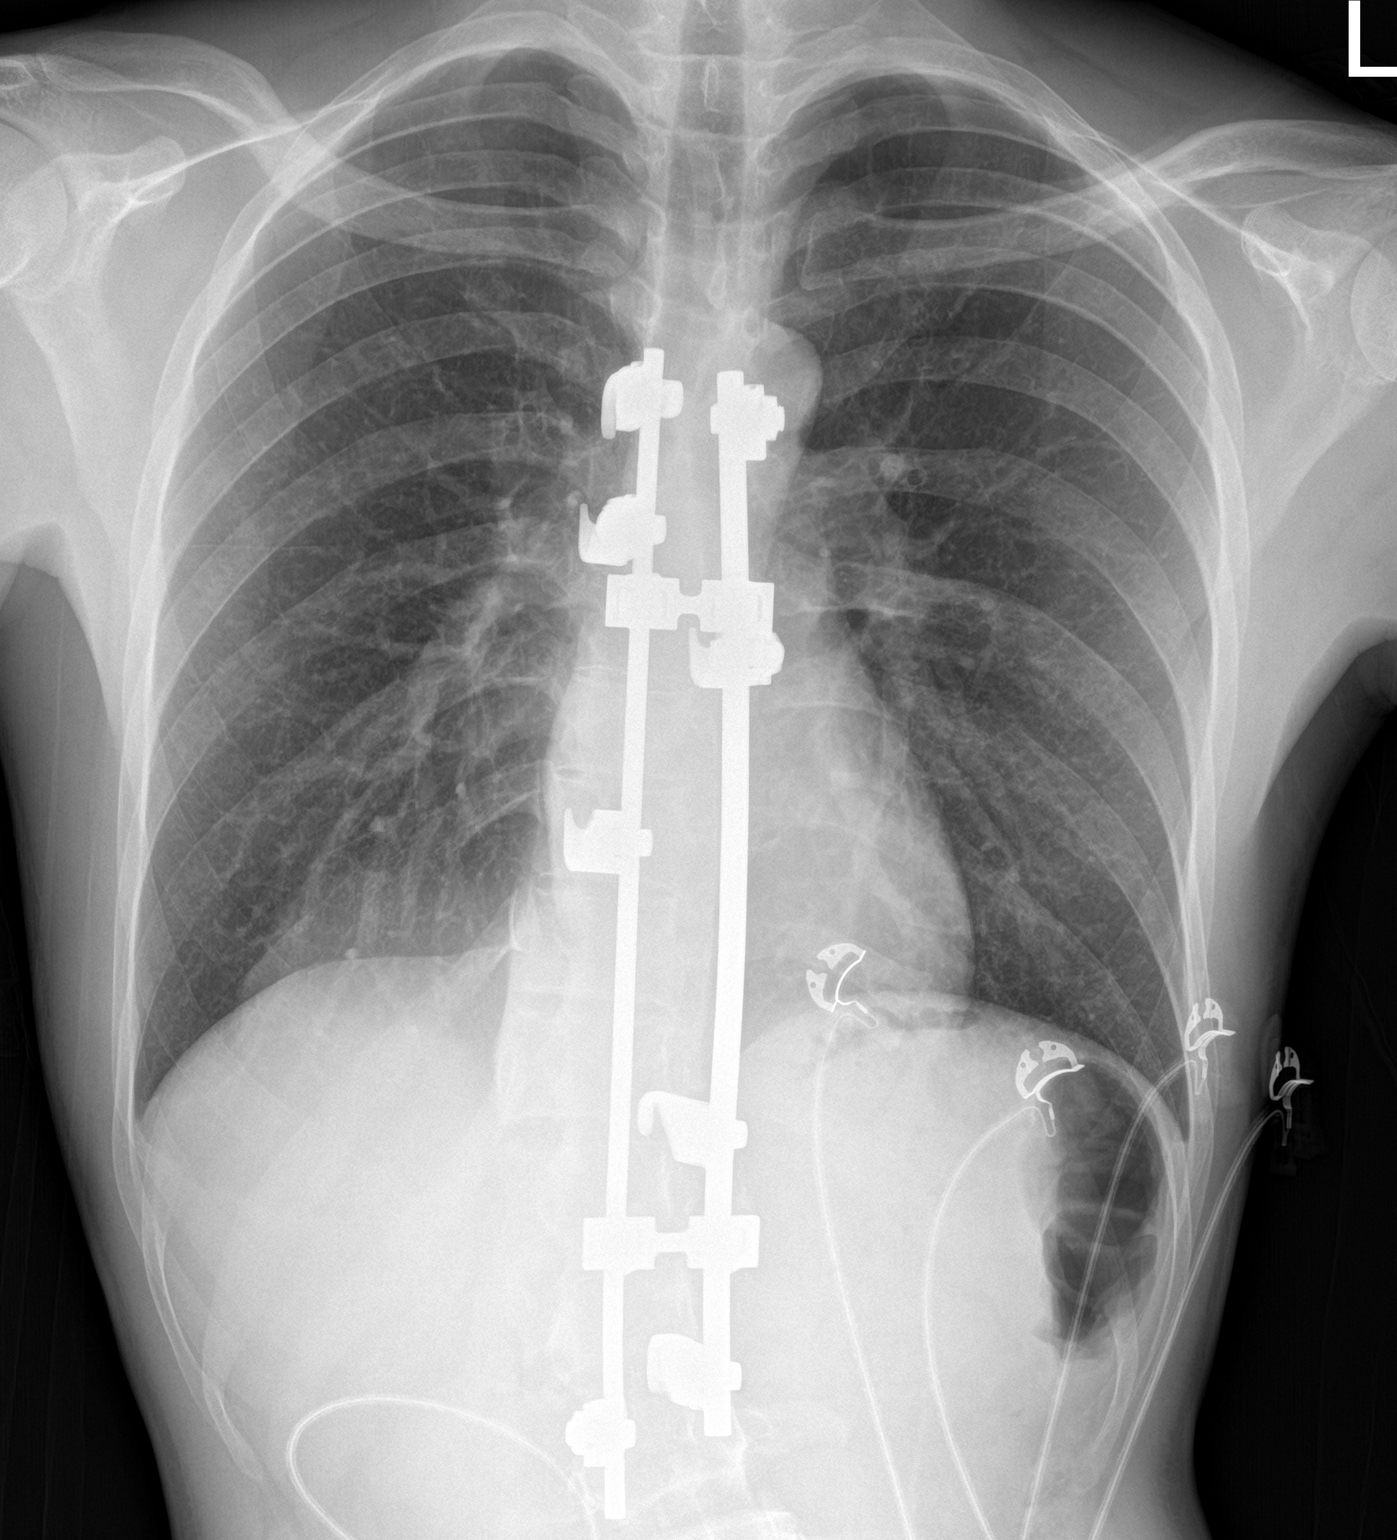

[chest lat]
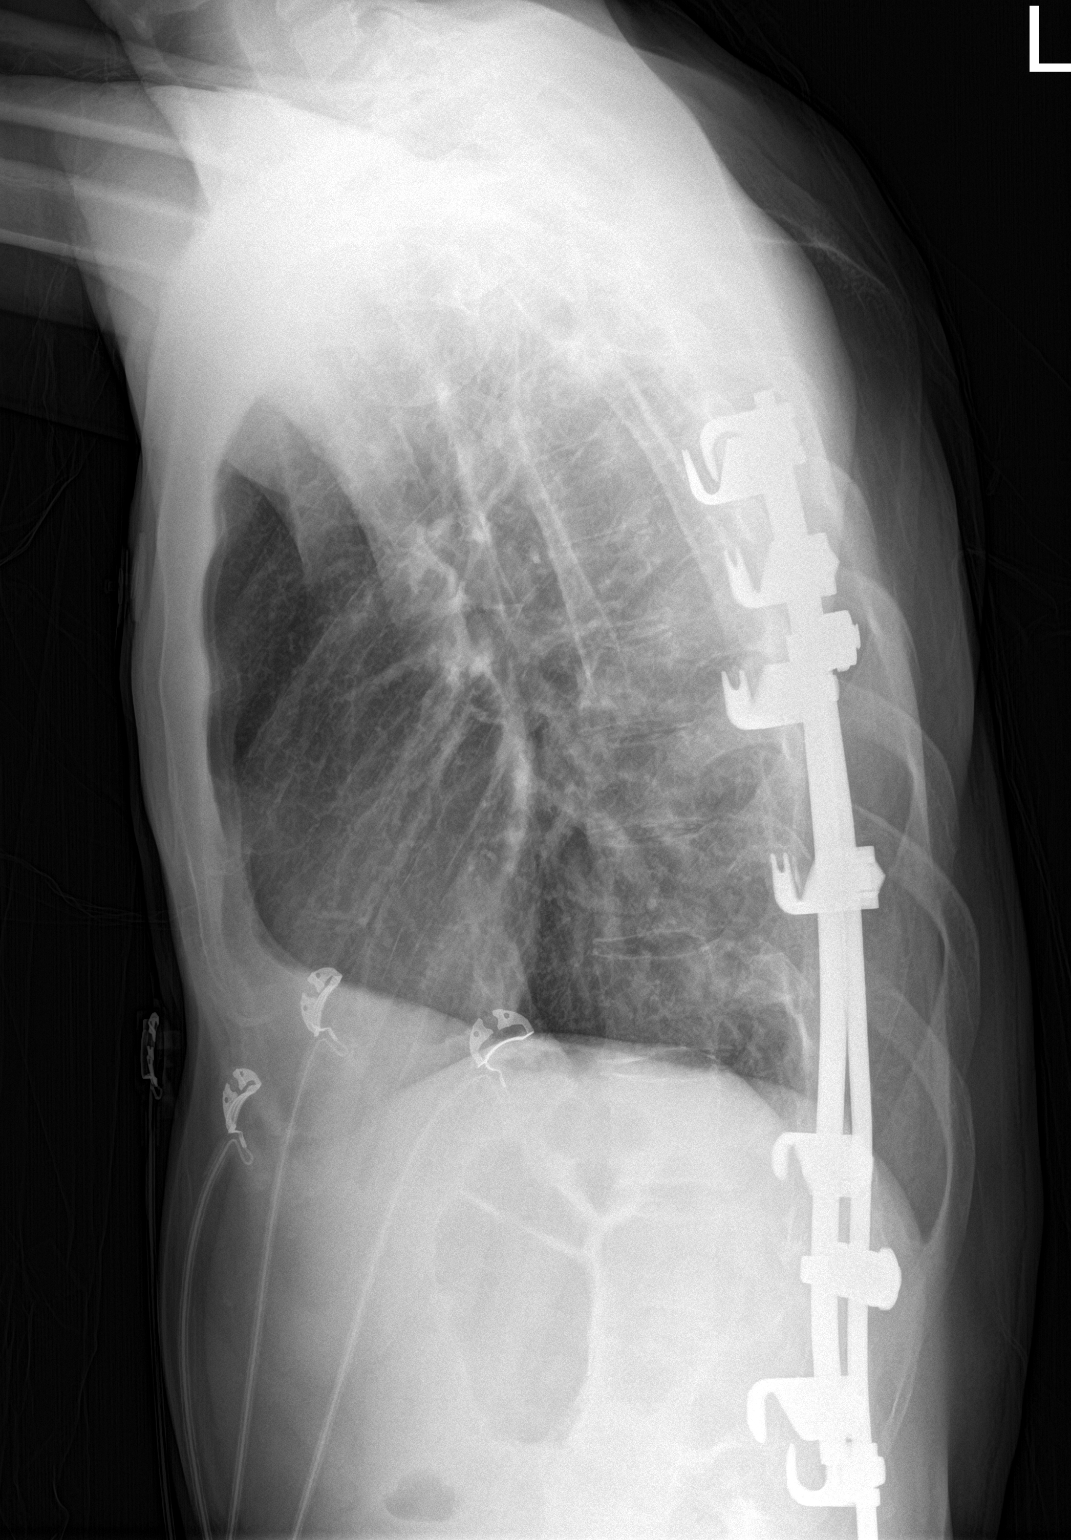

[2 of 2 positions shown; findings below may reference images not displayed]

FINDINGS: The heart size and vascular pattern are normal. There is no
consolidation or effusion. Spinal rods stable.
IMPRESSION: No active cardiopulmonary disease.

## 2018-12-06 ENCOUNTER — Other Ambulatory Visit: Payer: Self-pay | Admitting: Physical Medicine and Rehabilitation

## 2018-12-06 DIAGNOSIS — N289 Disorder of kidney and ureter, unspecified: Secondary | ICD-10-CM

## 2019-01-15 ENCOUNTER — Encounter (HOSPITAL_BASED_OUTPATIENT_CLINIC_OR_DEPARTMENT_OTHER): Payer: Self-pay | Admitting: Adult Health

## 2019-01-15 ENCOUNTER — Other Ambulatory Visit: Payer: Self-pay

## 2019-01-15 ENCOUNTER — Emergency Department (HOSPITAL_BASED_OUTPATIENT_CLINIC_OR_DEPARTMENT_OTHER)
Admission: EM | Admit: 2019-01-15 | Discharge: 2019-01-15 | Disposition: A | Discharge status: Home / Self Care | Payer: Medicaid Other | Attending: Emergency Medicine | Admitting: Emergency Medicine

## 2019-01-15 ENCOUNTER — Emergency Department (HOSPITAL_BASED_OUTPATIENT_CLINIC_OR_DEPARTMENT_OTHER): Payer: Medicaid Other

## 2019-01-15 DIAGNOSIS — M25552 Pain in left hip: Secondary | ICD-10-CM | POA: Insufficient documentation

## 2019-01-15 DIAGNOSIS — Z79899 Other long term (current) drug therapy: Secondary | ICD-10-CM | POA: Insufficient documentation

## 2019-01-15 DIAGNOSIS — M545 Low back pain: Secondary | ICD-10-CM | POA: Diagnosis not present

## 2019-01-15 DIAGNOSIS — S39012A Strain of muscle, fascia and tendon of lower back, initial encounter: Secondary | ICD-10-CM

## 2019-01-15 MED ORDER — METHOCARBAMOL 750 MG PO TABS: 750.0000 mg | ORAL_TABLET | Freq: Three times a day (TID) | ORAL | 0 refills | Status: DC | PRN

## 2019-01-15 NOTE — ED Triage Notes (Addendum)
Presents with back and hip pain that has been ongoing since December when he was placed on [employer]. HE reports that on Thursday he had a tree limb fall on him and since then it has exacerbated his back and left hip pain. He endorses left hip "feels like an intense catch that will not release" His back pain is from the center of the back all the way down. HE has a hx of herniated disc.

## 2019-01-15 NOTE — ED Notes (Signed)
Pt enrolled in aromatherapy pain trial 

## 2019-01-15 NOTE — ED Provider Notes (Addendum)
[facility] EMERGENCY DEPARTMENT Provider Note   CSN: [id number] Arrival date & time: 01/15/19  5784    History   Chief Complaint Chief Complaint  Patient presents with  . Hip Pain    HPI Henry Fields is a 38 y.o. male.     Patient c/o left hip and low back pain that has become acutely worse in the past 2 days. States two days ago, has fallen tree in yard, was trying to support one of the limbs to ensure it wouldn't fall onto area where kids were playing, and the limb fell against his back. Notes increased low back and left hip pain since. Pain constant, dull, moderate, non radiating. Worse w certain movements/positions. No head injury or loc. No neck pain. No leg numbness/weakness. Has been ambulatory since. Notes remote hx back surgery/scoliosis, and current on disability for low back/disc problems. No fever or chills. No gi or gu c/o.   The history is provided by the patient.  Hip Pain  Pertinent negatives include no chest pain, no abdominal pain and no headaches.    Past Medical History:  Diagnosis Date  . Kidney stones     There are no active problems to display for this patient.   Past Surgical History:  Procedure Laterality Date  . BACK SURGERY    . LITHOTRIPSY          Home Medications    Prior to Admission medications   Medication Sig Start Date End Date Taking? Authorizing Provider  dextromethorphan-guaiFENesin (MUCINEX DM) 30-600 MG 12hr tablet Take 1 tablet by mouth 2 (two) times daily. 11/27/16   Vanetta Mulders, MD  naproxen (NAPROSYN) 500 MG tablet Take 1 tablet (500 mg total) by mouth 2 (two) times daily. 11/27/16   Vanetta Mulders, MD    Family History History reviewed. No pertinent family history.  Social History Social History   Tobacco Use  . Smoking status: Never Smoker  . Smokeless tobacco: Never Used  Substance Use Topics  . Alcohol use: No  . Drug use: No     Allergies   Patient has no known allergies.    Review of Systems Review of Systems  Constitutional: Negative for chills and fever.  Cardiovascular: Negative for chest pain.  Gastrointestinal: Negative for abdominal pain.  Genitourinary: Negative for dysuria and hematuria.  Musculoskeletal: Positive for back pain. Negative for neck pain.  Neurological: Negative for weakness, numbness and headaches.     Physical Exam Updated Vital Signs BP 139/80 (BP Location: Right Arm)   Pulse 67   Temp 98 F (36.7 C) (Oral)   Resp 18   Ht 1.778 m (5\' 10" )   Wt 61.2 kg   SpO2 98%   BMI 19.37 kg/m   Physical Exam Vitals signs and nursing note reviewed.  Constitutional:      Appearance: Normal appearance. He is well-developed.  HENT:     Head: Atraumatic.     Nose: Nose normal.     Mouth/Throat:     Mouth: Mucous membranes are moist.  Eyes:     General: No scleral icterus.    Conjunctiva/sclera: Conjunctivae normal.  Neck:     Musculoskeletal: Normal range of motion and neck supple. No neck rigidity.     Trachea: No tracheal deviation.  Cardiovascular:     Rate and Rhythm: Normal rate and regular rhythm.     Pulses: Normal pulses.  Pulmonary:     Effort: Pulmonary effort is normal. No accessory muscle usage or  respiratory distress.  Abdominal:     General: There is no distension.  Genitourinary:    Comments: No cva tenderness. Musculoskeletal:        General: No swelling.     Comments: Lumbar tenderness, and left lumbar muscular tenderness. Tenderness left hip. Good passive rom at left hip and knee without pain. Distal pulses palp. No LLE swelling.   Skin:    General: Skin is warm and dry.     Findings: No rash.  Neurological:     Mental Status: He is alert.     Comments: Alert, speech clear. Steady gait. LLE motor/stre 5/5. sens intact.   Psychiatric:        Mood and Affect: Mood normal.      ED Treatments / Results  Labs (all labs ordered are listed, but only abnormal results are displayed) Labs Reviewed - No  data to display  EKG None  Radiology Dg Lumbar Spine Complete  Result Date: 01/15/2019 CLINICAL DATA:  Acute low back pain following fall 2 days ago. Initial encounter. EXAM: LUMBAR SPINE - COMPLETE 4+ VIEW COMPARISON:  03/19/2005 abdominal CT FINDINGS: No acute fracture or subluxation. Thoracolumbar scoliosis and posterior fixation rods along the thoracolumbar spine again noted. 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 No focal bony lesions. IMPRESSION: No acute abnormality. Electronically Signed   By: Harmon Pier M.D.   On: 01/15/2019 11:44   Dg Hip Unilat W Or W/o Pelvis 2-3 Views Left  Result Date: 01/15/2019 CLINICAL DATA:  Acute LEFT hip pain following injury 2 days ago. Initial encounter. EXAM: DG HIP (WITH OR WITHOUT PELVIS) 2-3V LEFT COMPARISON:  None. FINDINGS: There is no evidence of hip fracture or dislocation. There is no evidence of arthropathy or other focal bone abnormality. IMPRESSION: Negative. Electronically Signed   By: Harmon Pier M.D.   On: 01/15/2019 11:45    Procedures Procedures (including critical care time)  Medications Ordered in ED Medications - No data to display   Initial Impression / Assessment and Plan / ED Course  I have reviewed the triage vital signs and the nursing notes.  Pertinent labs & imaging results that were available during my care of the patient were reviewed by me and considered in my medical decision making (see chart for details).  Motrin po. xrays ordered.  Reviewed nursing notes and prior charts for additional history.   rx robaxin. Motrin/aleve prn.   Xrays reviewed - no acute fracture. Discussed w pt.  Patient appears stable for d/c.   rec pcp f/u.   Return precautions provided.     Final Clinical Impressions(s) / ED Diagnoses   Final diagnoses:  None    ED Discharge Orders    None          Cathren Laine, MD 01/15/19 1211

## 2019-01-15 NOTE — Discharge Instructions (Addendum)
It was our pleasure to provide your ER care today - we hope that you feel better.  Take motrin or aleve as need for pain. You may take robaxin as need for muscle pain/spasm - no driving when taking.   Follow up with primary care doctor in the next 1-2 weeks.   Return to ER if worse, new symptoms, numbness/weakness, other concern.

## 2019-01-15 NOTE — ED Notes (Signed)
Pt verbalized understanding of dc instructions.

## 2019-01-15 NOTE — ED Notes (Signed)
ED Provider at bedside. 

## 2020-02-05 ENCOUNTER — Other Ambulatory Visit: Payer: Self-pay

## 2020-02-05 ENCOUNTER — Emergency Department (HOSPITAL_BASED_OUTPATIENT_CLINIC_OR_DEPARTMENT_OTHER): Payer: Medicaid Other

## 2020-02-05 ENCOUNTER — Emergency Department (HOSPITAL_BASED_OUTPATIENT_CLINIC_OR_DEPARTMENT_OTHER)
Admission: EM | Admit: 2020-02-05 | Discharge: 2020-02-05 | Disposition: A | Discharge status: Home / Self Care | Payer: Medicaid Other | Attending: Emergency Medicine | Admitting: Emergency Medicine

## 2020-02-05 ENCOUNTER — Encounter (HOSPITAL_BASED_OUTPATIENT_CLINIC_OR_DEPARTMENT_OTHER): Payer: Self-pay | Admitting: Emergency Medicine

## 2020-02-05 DIAGNOSIS — R221 Localized swelling, mass and lump, neck: Secondary | ICD-10-CM | POA: Diagnosis not present

## 2020-02-05 DIAGNOSIS — M545 Low back pain, unspecified: Secondary | ICD-10-CM

## 2020-02-05 DIAGNOSIS — Z79899 Other long term (current) drug therapy: Secondary | ICD-10-CM | POA: Diagnosis not present

## 2020-02-05 DIAGNOSIS — R112 Nausea with vomiting, unspecified: Secondary | ICD-10-CM | POA: Insufficient documentation

## 2020-02-05 LAB — CBC WITH DIFFERENTIAL/PLATELET
Abs Immature Granulocytes: 0 10*3/uL (ref 0.00–0.07)
Basophils Absolute: 0 10*3/uL (ref 0.0–0.1)
Basophils Relative: 1 %
Eosinophils Absolute: 0.2 10*3/uL (ref 0.0–0.5)
Eosinophils Relative: 3 %
HCT: 43.8 % (ref 39.0–52.0)
Hemoglobin: 14.4 g/dL (ref 13.0–17.0)
Immature Granulocytes: 0 %
Lymphocytes Relative: 24 %
Lymphs Abs: 1.5 10*3/uL (ref 0.7–4.0)
MCH: 31 pg (ref 26.0–34.0)
MCHC: 32.9 g/dL (ref 30.0–36.0)
MCV: 94.2 fL (ref 80.0–100.0)
Monocytes Absolute: 0.8 10*3/uL (ref 0.1–1.0)
Monocytes Relative: 13 %
Neutro Abs: 3.8 10*3/uL (ref 1.7–7.7)
Neutrophils Relative %: 59 %
Platelets: 252 10*3/uL (ref 150–400)
RBC: 4.65 MIL/uL (ref 4.22–5.81)
RDW: 12.7 % (ref 11.5–15.5)
WBC: 6.3 10*3/uL (ref 4.0–10.5)
nRBC: 0 % (ref 0.0–0.2)

## 2020-02-05 LAB — BASIC METABOLIC PANEL
Anion gap: 7 (ref 5–15)
BUN: 13 mg/dL (ref 6–20)
CO2: 31 mmol/L (ref 22–32)
Calcium: 9.3 mg/dL (ref 8.9–10.3)
Chloride: 103 mmol/L (ref 98–111)
Creatinine, Ser: 1.23 mg/dL (ref 0.61–1.24)
GFR calc Af Amer: >60 mL/min (ref >60)
GFR calc non Af Amer: >60 mL/min (ref >60)
Glucose, Bld: 100 mg/dL — ABNORMAL HIGH (ref 70–99)
Potassium: 3.8 mmol/L (ref 3.5–5.1)
Sodium: 141 mmol/L (ref 135–145)

## 2020-02-05 MED ORDER — IBUPROFEN 800 MG PO TABS: 800.0000 mg | ORAL_TABLET | Freq: Once | ORAL | Status: DC

## 2020-02-05 MED ORDER — METHOCARBAMOL 500 MG PO TABS: 500.0000 mg | ORAL_TABLET | Freq: Two times a day (BID) | ORAL | 0 refills | Status: AC

## 2020-02-05 MED ORDER — NAPROXEN 500 MG PO TABS: 500.0000 mg | ORAL_TABLET | Freq: Two times a day (BID) | ORAL | 0 refills | Status: AC

## 2020-02-05 MED ORDER — IOHEXOL 300 MG/ML  SOLN
100.0000 mL | Freq: Once | INTRAMUSCULAR | Status: AC | PRN
  Administered 2020-02-05: 75 mL via INTRAVENOUS

## 2020-02-05 NOTE — ED Notes (Signed)
ED Provider at bedside. 

## 2020-02-05 NOTE — ED Triage Notes (Signed)
Pt states that he has a hx of pain in his back and neck. He reports a " lump" in his right neck that he has had for a couple of months. He states that his wife made him come in because " for awhile" he has had increased pain in his back. Yesterday he reports N/V

## 2020-02-05 NOTE — ED Notes (Signed)
Patient transported to CT 

## 2020-02-05 NOTE — ED Provider Notes (Signed)
[facility]ERG[facility] DEPARTMENT Provider Note   CSN: [id number] Arrival date & time: 02/05/20  1033     History Chief Complaint  Patient presents with  . Back Pain    Henry Fields is a 39 y.o. male with no significant past medical history who presents to the ED due to numerous complaints.  First patient admits to worsening low back pain that has been going on for numerous months.  Patient has a history of chronic low back pain.  Patient has not taken anything for his back pain.  Denies recent injury to back.  Patient notes he has a history of scoliosis and has had rods placed in his back in 1998.  Denies saddle paresthesias, bowel/bladder incontinence, lower extremity numbness/tingling, lower extremity weakness, IV drug use, history of cancer, fever and chills.  He denies abdominal pain and urinary symptoms.  Patient also admits to a lump on the right side of his neck for the past few months.  He notes the lump has increased in size.  He admits to discomfort when palpating the lump.  Denies sore throat, cough, rhinorrhea, and other infectious symptoms.  Patient also admits to numerous episodes of nonbloody, nonbilious emesis yesterday which has completely resolved.  Denies abdominal pain.  Denies diarrhea.  No sick contacts or Covid exposures.  Denies chest pain and shortness of breath.  History obtained from patient and past medical records. No interpreter used during encounter.       Past Medical History:  Diagnosis Date  . Kidney stones     There are no problems to display for this patient.   Past Surgical History:  Procedure Laterality Date  . BACK SURGERY    . LITHOTRIPSY         History reviewed. No pertinent family history.  Social History   Tobacco Use  . Smoking status: Never Smoker  . Smokeless tobacco: Never Used  Substance Use Topics  . Alcohol use: No  . Drug use: No    Home Medications Prior to Admission medications   Medication Sig  Start Date End Date Taking? Authorizing Provider  dextromethorphan-guaiFENesin (MUCINEX DM) 30-600 MG 12hr tablet Take 1 tablet by mouth 2 (two) times daily. 11/27/16   Fredia Sorrow, MD  methocarbamol (ROBAXIN) 500 MG tablet Take 1 tablet (500 mg total) by mouth 2 (two) times daily. 02/05/20   Suzy Bouchard, PA-C  methocarbamol (ROBAXIN) 750 MG tablet Take 1 tablet (750 mg total) by mouth 3 (three) times daily as needed (muscle spasm/pain). 01/15/19   Lajean Saver, MD  naproxen (NAPROSYN) 500 MG tablet Take 1 tablet (500 mg total) by mouth 2 (two) times daily. 11/27/16   Fredia Sorrow, MD  naproxen (NAPROSYN) 500 MG tablet Take 1 tablet (500 mg total) by mouth 2 (two) times daily. 02/05/20   Suzy Bouchard, PA-C    Allergies    Patient has no known allergies.  Review of Systems   Review of Systems  Constitutional: Negative for chills and fever.  Respiratory: Negative for shortness of breath.   Cardiovascular: Negative for chest pain.  Gastrointestinal: Positive for diarrhea and nausea. Negative for abdominal pain.  Genitourinary: Negative for dysuria.  Musculoskeletal: Positive for back pain and neck pain (right side knot on neck). Negative for gait problem.  Neurological: Negative for weakness and headaches.  All other systems reviewed and are negative.   Physical Exam Updated Vital Signs BP 107/67 (BP Location: Right Arm)   Pulse (!) 54  Temp 98 F (36.7 C) (Oral)   Resp 16   Ht 5\' 10"  (1.778 m)   Wt 58.1 kg   SpO2 100%   BMI 18.37 kg/m   Physical Exam Vitals and nursing note reviewed.  Constitutional:      General: He is not in acute distress.    Appearance: He is not ill-appearing.  HENT:     Head: Normocephalic.  Eyes:     Pupils: Pupils are equal, round, and reactive to light.  Neck:     Comments: Circular mass in supraclavicular region.  No surrounding erythema.  No meningismus. Cardiovascular:     Rate and Rhythm: Normal rate and regular rhythm.      Pulses: Normal pulses.     Heart sounds: Normal heart sounds. No murmur. No friction rub. No gallop.   Pulmonary:     Effort: Pulmonary effort is normal.     Breath sounds: Normal breath sounds.  Abdominal:     General: Abdomen is flat. There is no distension.     Palpations: Abdomen is soft.     Tenderness: There is no abdominal tenderness. There is no guarding or rebound.  Musculoskeletal:     Cervical back: Neck supple.     Comments: No T-spine and L-spine midline tenderness, no stepoff or deformity, reproducible bilateral paraspinal tenderness No leg edema bilaterally Patient moves all extremities without difficulty. DP/PT pulses 2+ and equal bilaterally Sensation grossly intact bilaterally Strength of knee flexion and extension is 5/5 Plantar and dorsiflexion of ankle 5/5 Achilles and patellar reflexes present and equal Able to ambulate without difficulty   Skin:    General: Skin is warm and dry.  Neurological:     General: No focal deficit present.     Mental Status: He is alert.  Psychiatric:        Mood and Affect: Mood normal.        Behavior: Behavior normal.     ED Results / Procedures / Treatments   Labs (all labs ordered are listed, but only abnormal results are displayed) Labs Reviewed  BASIC METABOLIC PANEL - Abnormal; Notable for the following components:      Result Value   Glucose, Bld 100 (*)    All other components within normal limits  CBC WITH DIFFERENTIAL/PLATELET    EKG None  Radiology CT Soft Tissue Neck W Contrast  Result Date: 02/05/2020 CLINICAL DATA:  Neck mass, initial workup. EXAM: CT NECK WITH CONTRAST TECHNIQUE: Multidetector CT imaging of the neck was performed using the standard protocol following the bolus administration of intravenous contrast. CONTRAST:  62mL OMNIPAQUE IOHEXOL 300 MG/ML  SOLN COMPARISON:  None. FINDINGS: Pharynx and larynx: No evidence of mass or swelling. Salivary glands: No inflammation, mass, or stone.  Thyroid: Normal. Lymph nodes: None enlarged or abnormal density. Vascular: Negative. Limited intracranial: Negative. Visualized orbits: Negative. Mastoids and visualized paranasal sinuses: Clear. Skeleton: No acute or aggressive process. Upper chest: Negative. Other: Palpable complaint reflects a 20 x 13 mm cystic density in the supraclavicular fossa which has a simple cystic appearance. No enhancement for a venous diverticulum. No associated soft tissue or internal septation either by CT or ultrasound. No communication with the sternoclavicular joint which appears non degenerated. Location is above the brachial plexus. IMPRESSION: Palpable complaint reflects a 20 x 13 mm right supraclavicular cyst which has a simple appearance favoring lymphangioma or other benign process. Electronically Signed   By: 72m M.D.   On: 02/05/2020 13:10   04/06/2020  SOFT TISSUE HEAD & NECK (NON-THYROID)  Result Date: 02/05/2020 CLINICAL DATA:  Right supraclavicular lump for 1 month. EXAM: ULTRASOUND OF HEAD/NECK SOFT TISSUES TECHNIQUE: Ultrasound examination of the head and neck soft tissues was performed in the area of clinical concern. COMPARISON:  None. FINDINGS: Palpable complaint reflects a cystic character structure in the supraclavicular region which measures up to 18 x 14 mm. No internal color Doppler filling to suggest aneurysm or venous diverticulum. No visible continuity with the sternoclavicular joint (which is not covered) to imply a ganglion. No peripheral soft tissue to suggest a cystic node. Unilocular lymphangioma would be considered but reportedly the cyst is only been present for a few months. IMPRESSION: Palpable complaint correlates with an 18 mm right supraclavicular cyst with favorable simplicity but no evident cause on this study. Enhanced neck CT would be needed to further characterize. Electronically Signed   By: Marnee Spring M.D.   On: 02/05/2020 12:18    Procedures Procedures (including critical  care time)  Medications Ordered in ED Medications  ibuprofen (ADVIL) tablet 800 mg (has no administration in time range)  iohexol (OMNIPAQUE) 300 MG/ML solution 100 mL (75 mLs Intravenous Contrast Given 02/05/20 1243)    ED Course  I have reviewed the triage vital signs and the nursing notes.  Pertinent labs & imaging results that were available during my care of the patient were reviewed by me and considered in my medical decision making (see chart for details).    MDM Rules/Calculators/A&P                     39 year old male presents to the ED due to numerous complaints of acute on chronic low back pain, right-sided mass in neck, and nausea/vomiting yesterday that has resolved.  Patient has a history of scoliosis and previous back surgery in 1998.  Denies saddle paresthesias, bowel/bladder incontinence, lower extremity numbness/tingling, lower extremity weakness, fever/chills, IV drug use, and history of cancer.  Vitals all within normal limits.  Patient no acute distress and non-ill-appearing.  Physical exam reassuring.  Abdomen soft, nondistended, and nontender.  Right circular mass in supraclavicular region.  Suspect lymphadenopathy vs. cysts.  Bilateral lumbar paraspinal tenderness.  No cervical, thoracic, or lumbar midline tenderness.  Lower extremities neurovascularly intact.  Patient able to ambulate in the ED without difficulty.  Will obtain ultrasound of soft tissue of neck for further evaluation of mass.  Will also obtain routine labs given numerous episodes of nonbloody, nonbilious emesis. Ibuprofen given for back pain. Patient has to work this afternoon so deferred muscle relaxer at this time. No concern for cauda equina or central cord compression.   CBC unremarkable with no leukocytosis.  BMP reassuring with normal renal function and no electrolyte derangements.  CT neck and ultrasound personally reviewed which demonstrates: IMPRESSION:  Palpable complaint reflects a 20 x 13 mm  right supraclavicular cyst  which has a simple appearance favoring lymphangioma or other benign  process.   IMPRESSION:  Palpable complaint correlates with an 18 mm right supraclavicular  cyst with favorable simplicity but no evident cause on this study.  Enhanced neck CT would be needed to further characterize.   We will discharge patient with [facility] number.  Advised patient to call [facility] tomorrow to schedule appointment for further evaluation of cyst and to establish care.  Orthopedist number given to patient at discharge for further evaluation of back pain. Suspect back pain related to chronic pain from scoliosis.  Will treat back  pain symptomatically with naproxen and Robaxin.  Advised patient to purchase over-the-counter Lidoderm patches and Voltaren gel as needed for added pain relief. Strict ED precautions discussed with patient. Patient states understanding and agrees to plan. Patient discharged home in no acute distress and stable vitals  Final Clinical Impression(s) / ED Diagnoses Final diagnoses:  Mass of right side of neck  Acute bilateral low back pain without sciatica  Nausea and vomiting, intractability of vomiting not specified, unspecified vomiting type    Rx / DC Orders ED Discharge Orders         Ordered    naproxen (NAPROSYN) 500 MG tablet  2 times daily     02/05/20 1326    methocarbamol (ROBAXIN) 500 MG tablet  2 times daily     02/05/20 1326           Mannie Stabile, PA-C 02/05/20 1341    Rolan Bucco, MD 02/05/20 1418

## 2020-02-05 NOTE — ED Notes (Signed)
Pt requested to wait for ibuprofen until he cant have something to eat to avoid stomach upset. EDP notified

## 2020-02-05 NOTE — Discharge Instructions (Addendum)
As discussed, all of your labs were reassuring. You CT neck showed a cyst with is not harmful. I have included the number of [facility] in [place]. Please call Monday to schedule an appointment to establish care. I am sending you home with pain medication for your back pain. Take as prescribed. Robaxin is a muscle relaxer and can cause drowsiness, so do not drive or operate machinery while on the medication.  You may also purchase over-the-counter Lidoderm patches and Voltaren gel for added pain relief. I have also included the number of the orthopedic doctor. Please call for further evaluation of back pain. Return to the ER for new or worsening symptoms.

## 2020-02-23 IMAGING — CR LUMBAR SPINE - COMPLETE 4+ VIEW
5 series · 5 of 5 positions shown · non-contrast
Comparison: 03/19/2005 abdominal CT

CLINICAL DATA: Acute low back pain following fall 2 days ago.
Initial encounter.

EXAM:
LUMBAR SPINE - COMPLETE 4+ VIEW

[t l-spine a.p.]
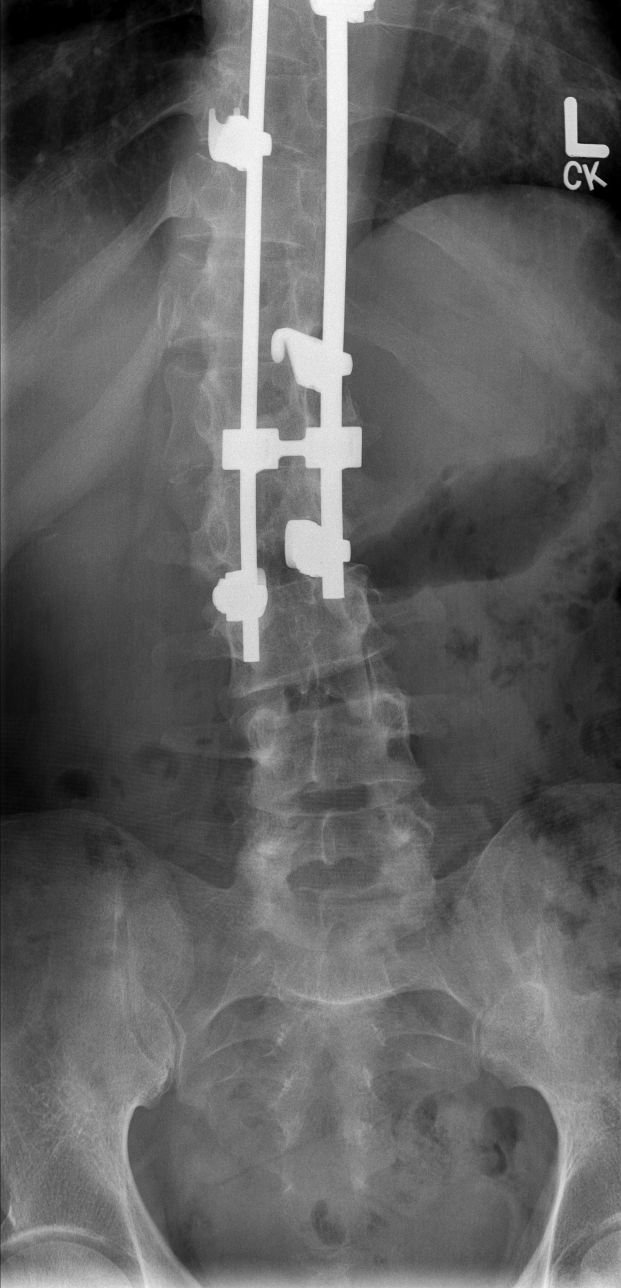

[t l-spine oblique exposure (1 of 2)]
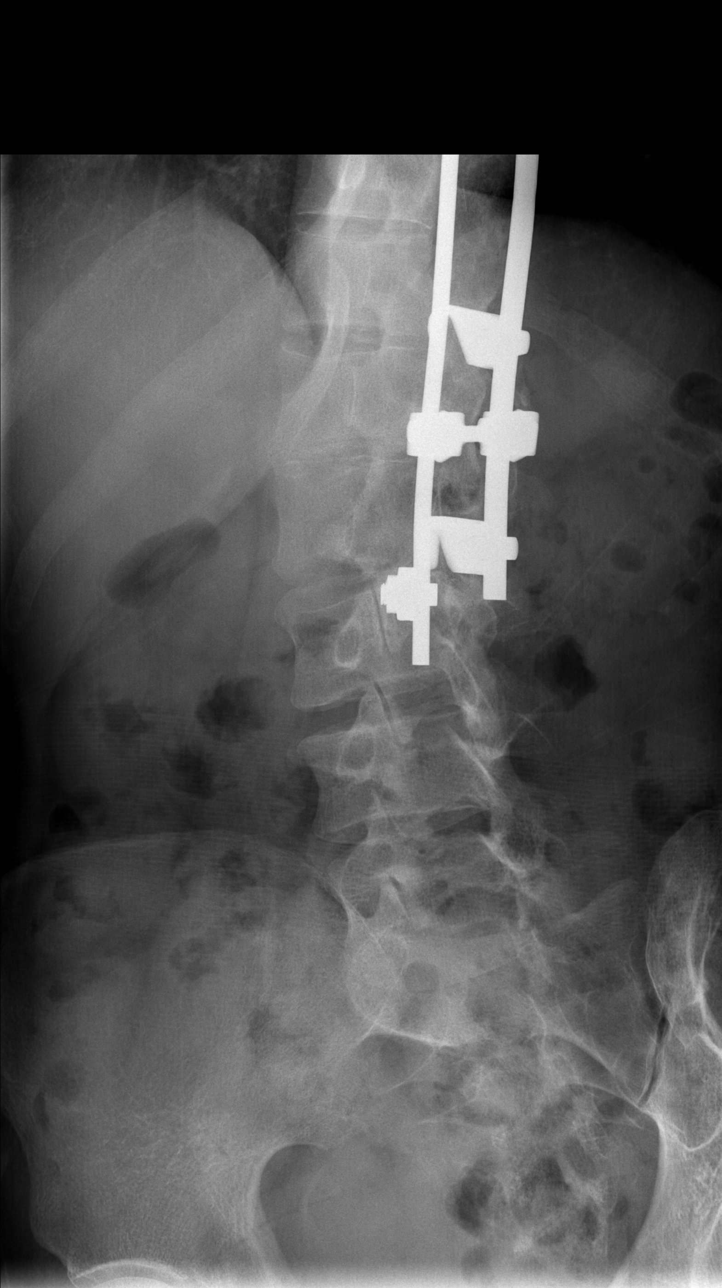

[t l-spine oblique exposure (2 of 2)]
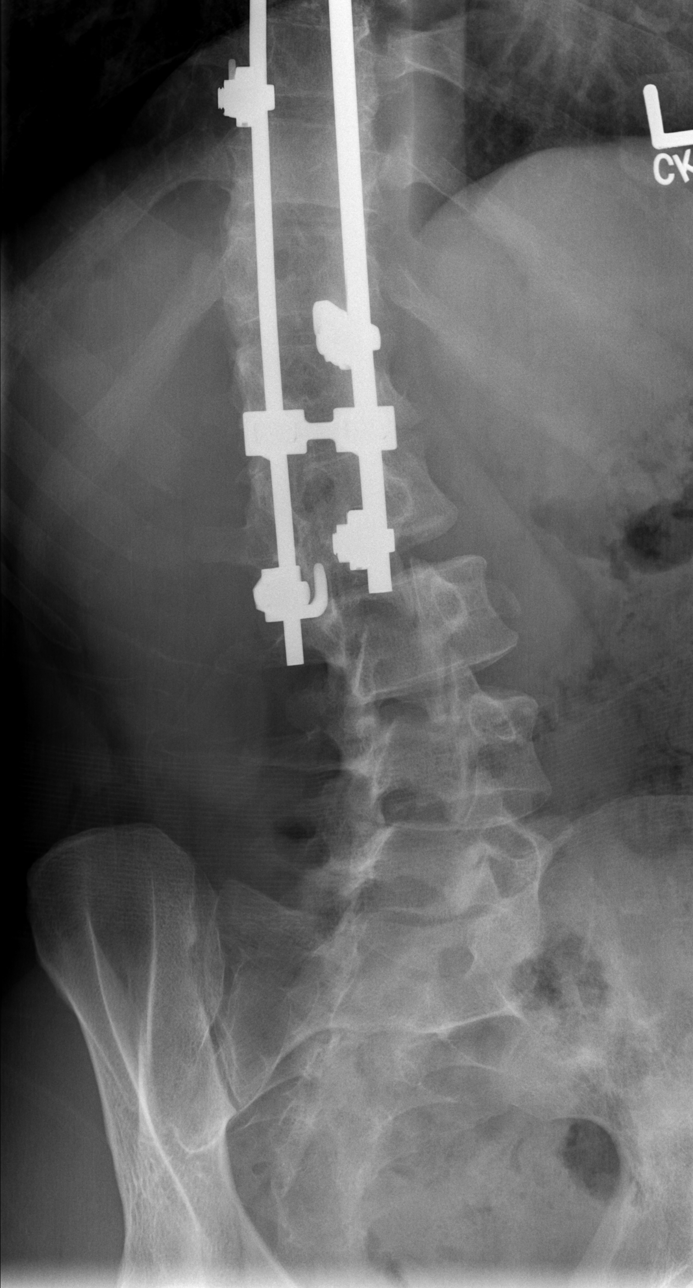

[t l-spine lat]
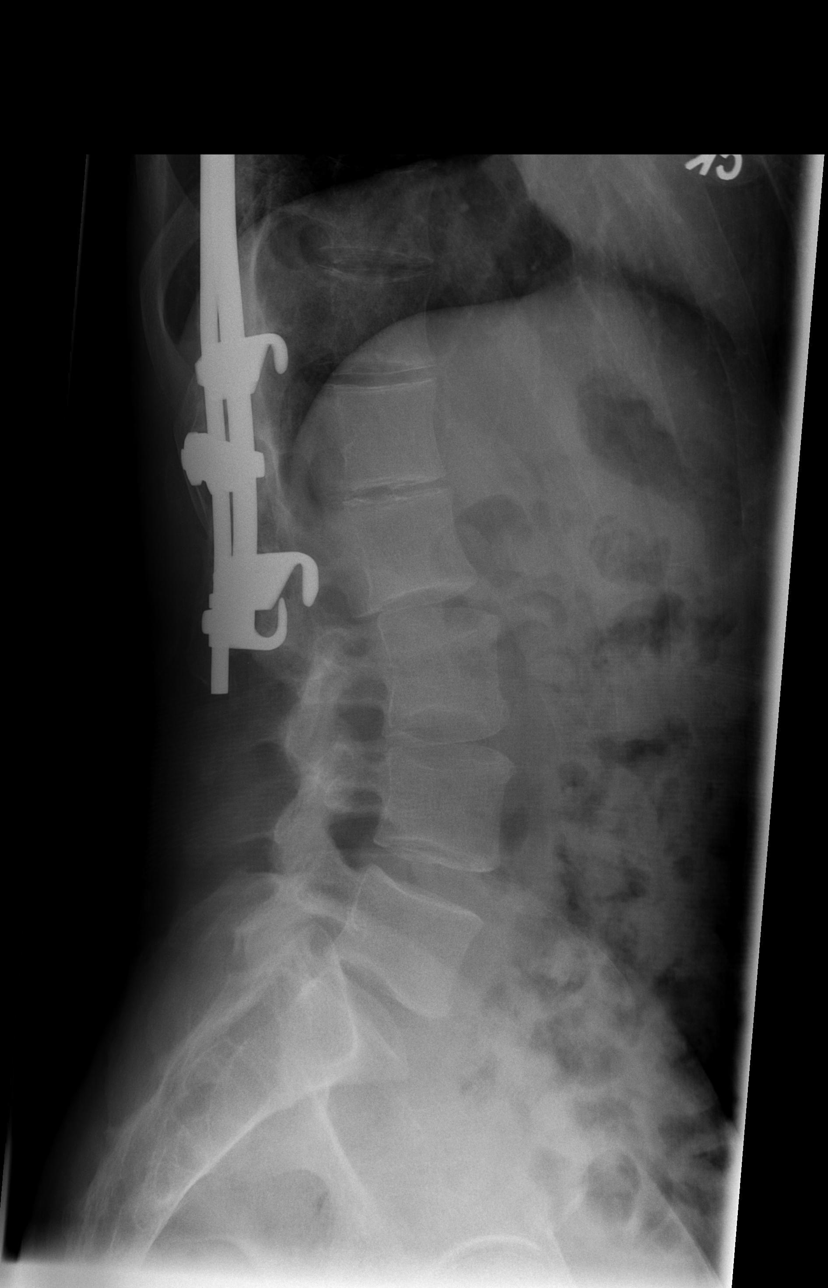

[t l-spine l5-s1 spot]
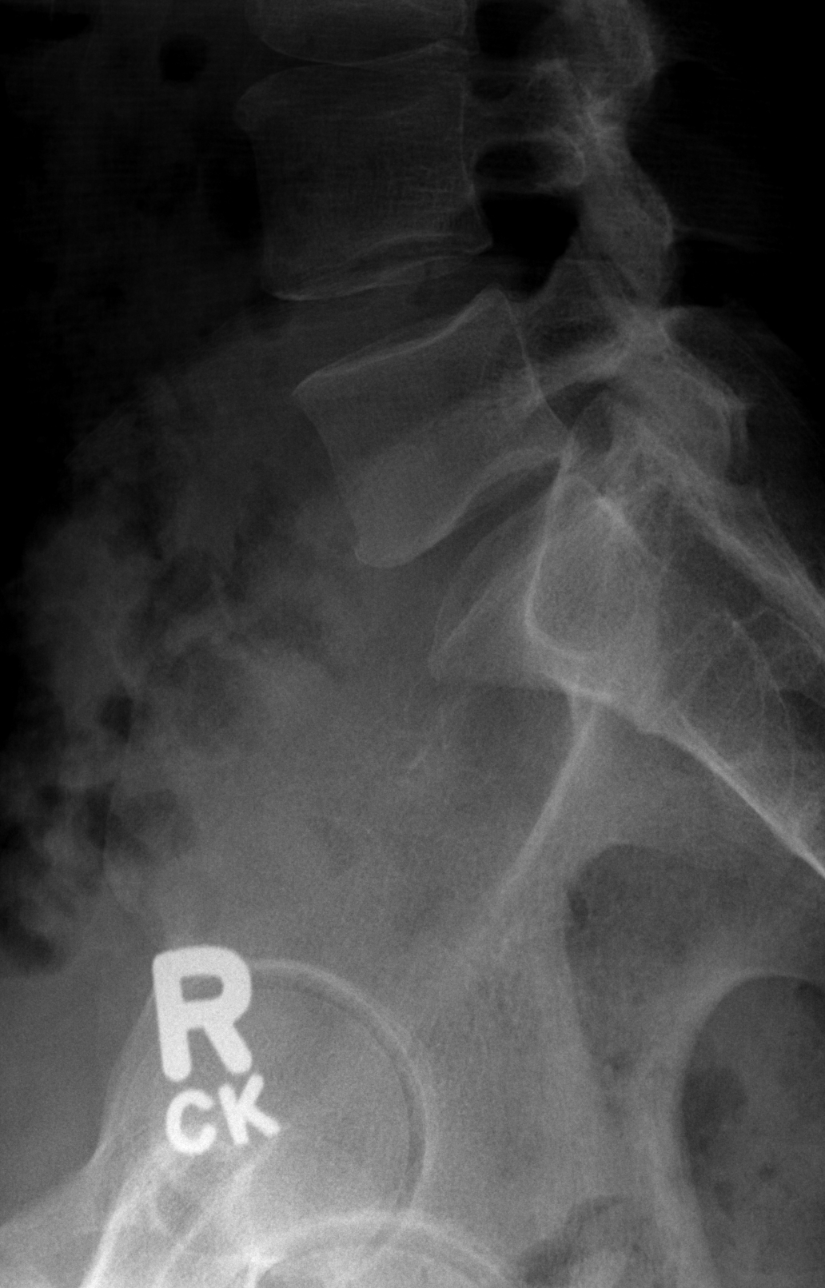

[5 of 5 positions shown; findings below may reference images not displayed]

FINDINGS: No acute fracture or subluxation.

Thoracolumbar scoliosis and posterior fixation rods along the
thoracolumbar spine again noted.

Mild degenerative disc disease UPPER lumbar spine noted. No focal
bony lesions.
IMPRESSION: No acute abnormality.

## 2021-03-15 IMAGING — CT CT NECK W/ CM
1 series · 12 of 14 positions shown, 15 images · IV contrast (Omnipaque)
Comparison: None.

CLINICAL DATA: Neck mass, initial workup.

EXAM:
CT NECK WITH CONTRAST
TECHNIQUE: Multidetector CT imaging of the neck was performed using the
standard protocol following the bolus administration of intravenous
contrast.
CONTRAST:  75mL OMNIPAQUE IOHEXOL 300 MG/ML  SOLN

[Series 8: orthogonal ax · axial · 0.39mm/px · z∈[-329,-96]mm · 12 of 142 slices shown, 15 images]
[im 11/142  soft-tissue]
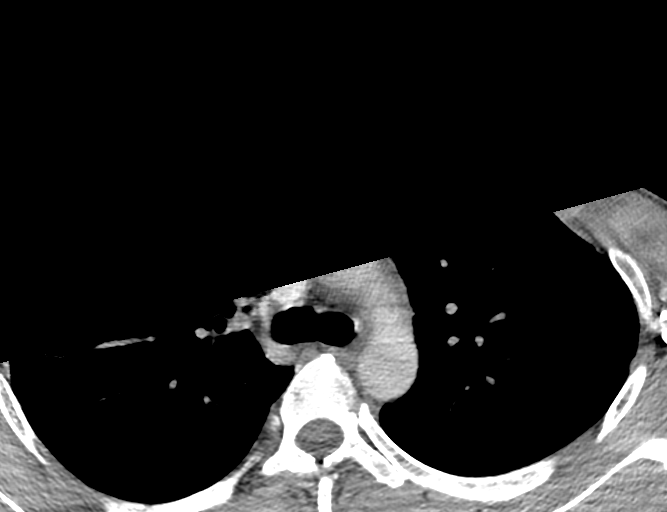
[im 11/142  bone]
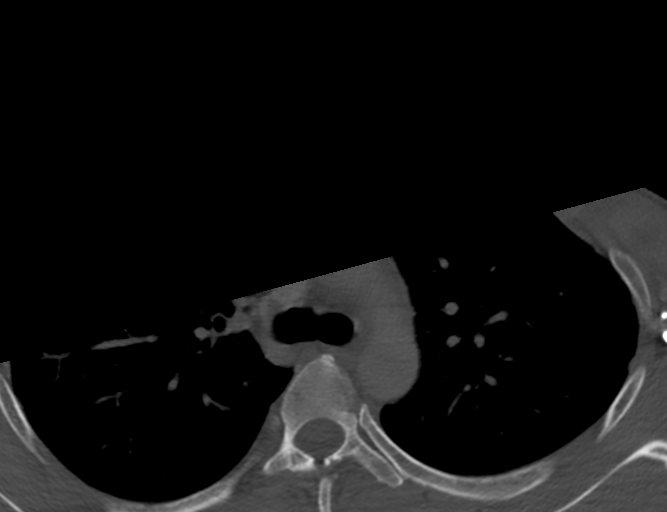
[im 22/142  bone]
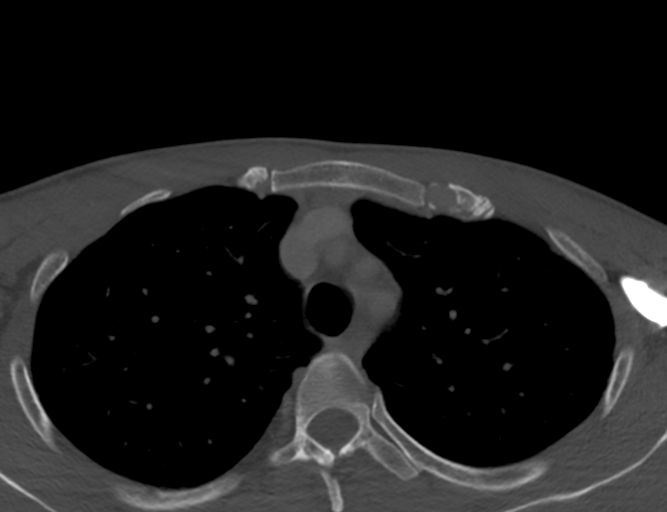
[im 33/142  bone]
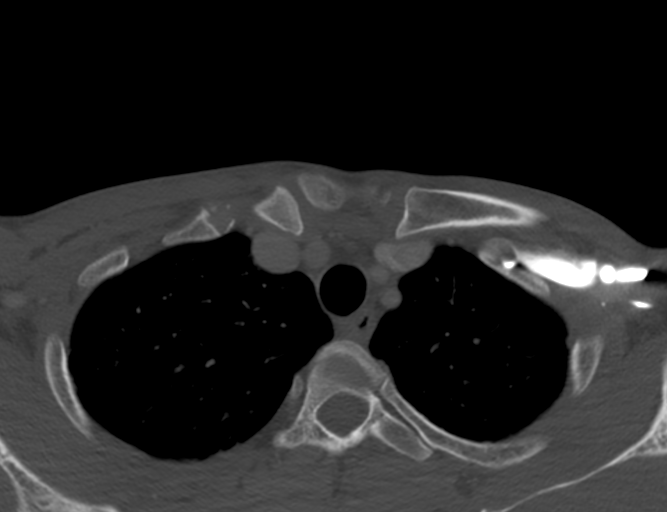
[im 44/142  bone]
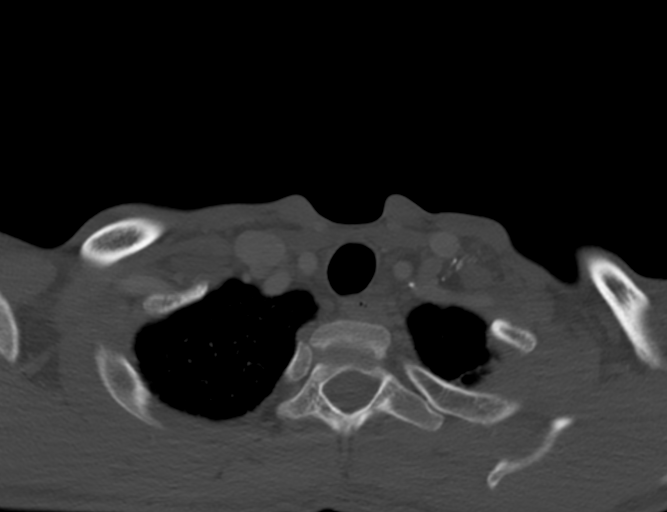
[im 55/142  soft-tissue]
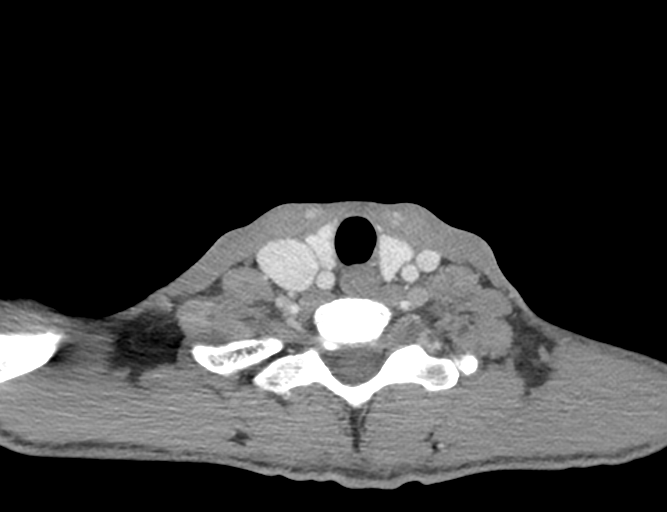
[im 55/142  bone]
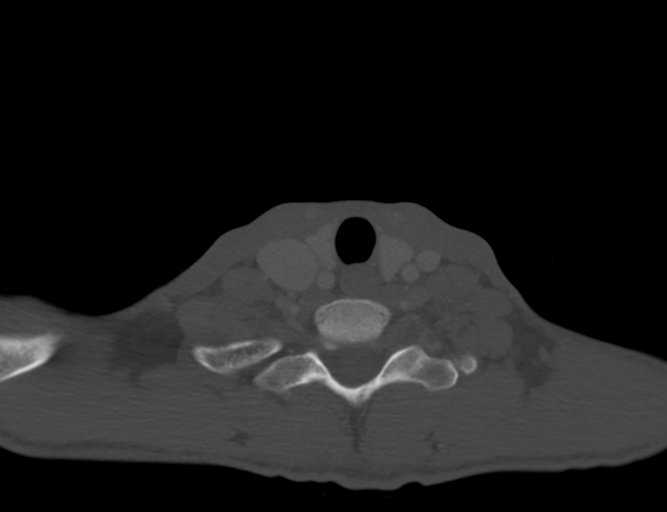
[im 66/142  bone]
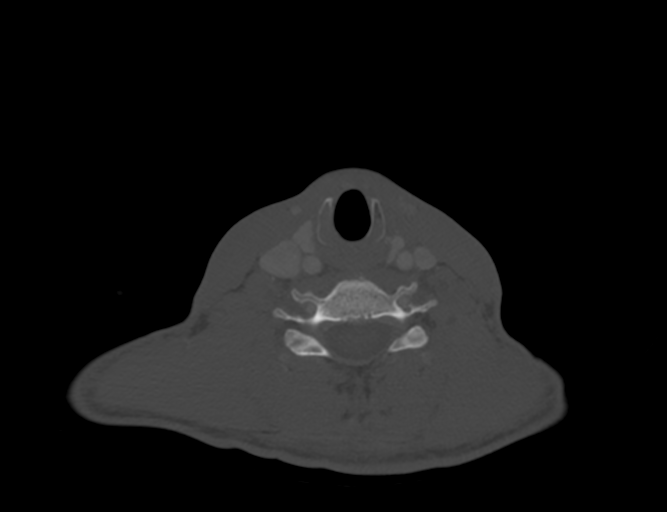
[im 76/142  bone]
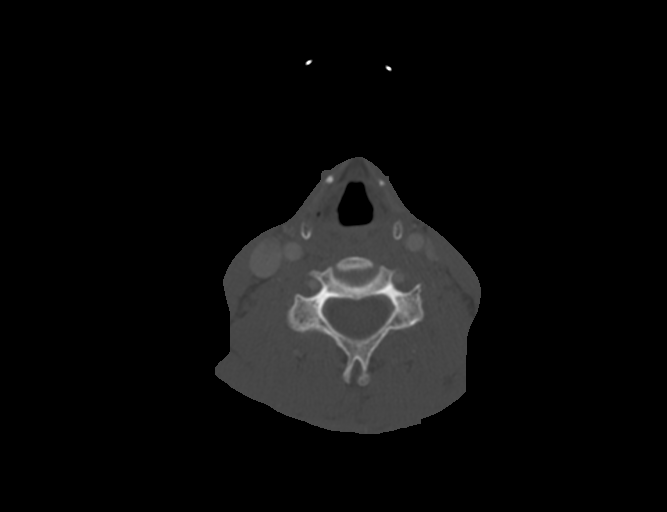
[im 87/142  bone]
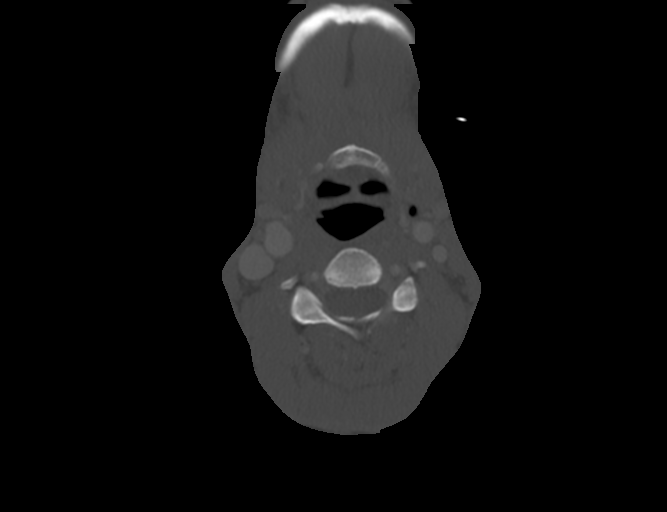
[im 98/142  soft-tissue]
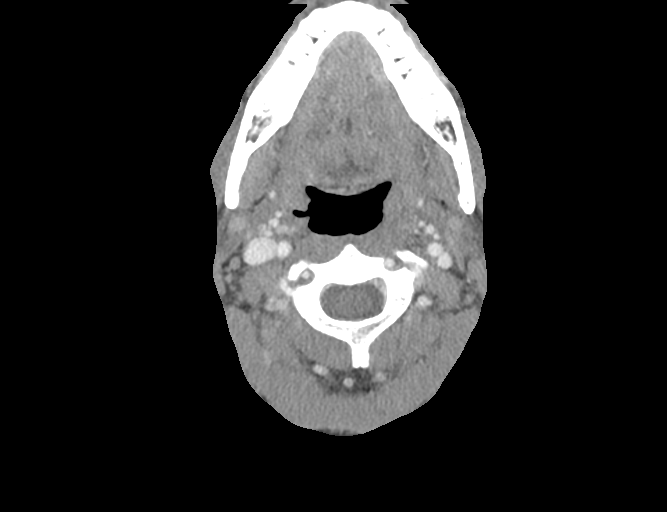
[im 98/142  bone]
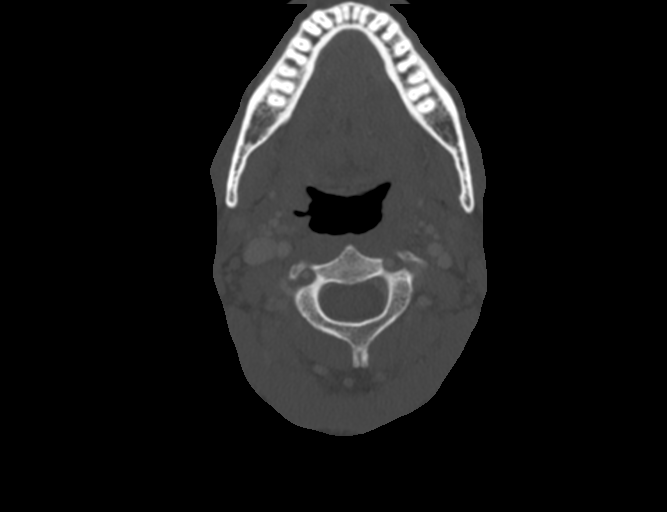
[im 109/142  bone]
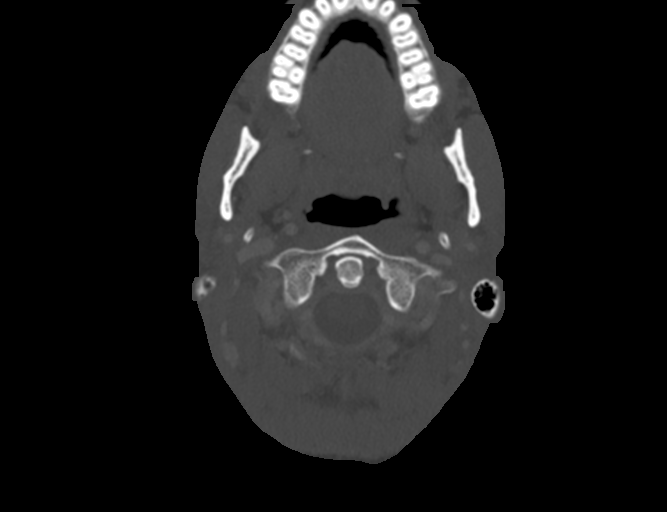
[im 120/142  bone]
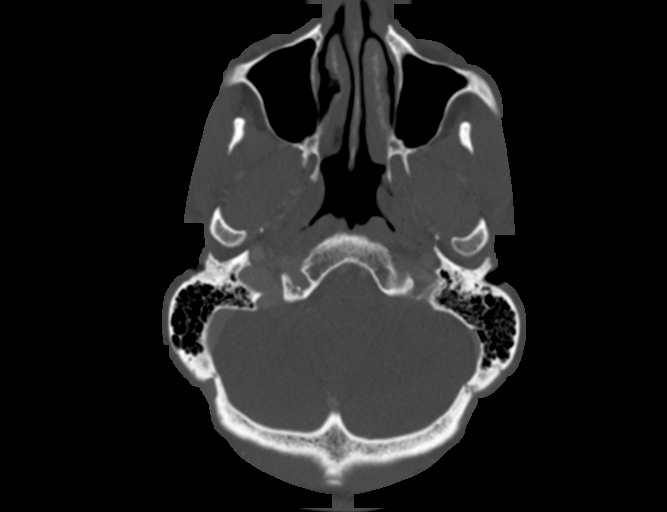
[im 131/142  bone]
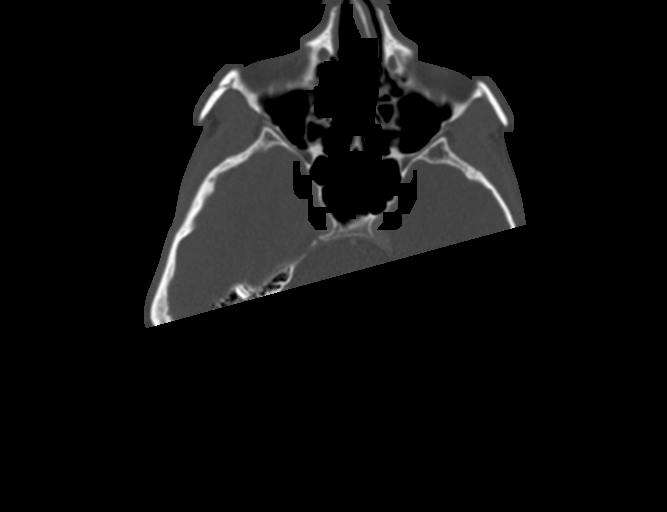

[12 of 14 positions shown; findings below may reference images not displayed]

FINDINGS: Pharynx and larynx: No evidence of mass or swelling.

Salivary glands: No inflammation, mass, or stone.

Thyroid: Normal.

Lymph nodes: None enlarged or abnormal density.

Vascular: Negative.

Limited intracranial: Negative.

Visualized orbits: Negative.

Mastoids and visualized paranasal sinuses: Clear.

Skeleton: No acute or aggressive process.

Upper chest: Negative.

Other: Palpable complaint reflects a 20 x 13 mm cystic density in
the supraclavicular fossa which has a simple cystic appearance. No
enhancement for a venous diverticulum. No associated soft tissue or
internal septation either by CT or ultrasound. No communication with
the sternoclavicular joint which appears non degenerated. Location
is above the brachial plexus.
IMPRESSION: Palpable complaint reflects a 20 x 13 mm right supraclavicular cyst
which has a simple appearance favoring lymphangioma or other benign
process.

## 2021-05-03 ENCOUNTER — Encounter (HOSPITAL_BASED_OUTPATIENT_CLINIC_OR_DEPARTMENT_OTHER): Payer: Self-pay | Admitting: *Deleted

## 2021-05-03 ENCOUNTER — Emergency Department (HOSPITAL_BASED_OUTPATIENT_CLINIC_OR_DEPARTMENT_OTHER)
Admission: EM | Admit: 2021-05-03 | Discharge: 2021-05-03 | Disposition: A | Discharge status: Home / Self Care | Payer: BC Managed Care – PPO | Attending: Emergency Medicine | Admitting: Emergency Medicine

## 2021-05-03 ENCOUNTER — Other Ambulatory Visit: Payer: Self-pay

## 2021-05-03 DIAGNOSIS — T65891A Toxic effect of other specified substances, accidental (unintentional), initial encounter: Secondary | ICD-10-CM | POA: Diagnosis not present

## 2021-05-03 DIAGNOSIS — Z77098 Contact with and (suspected) exposure to other hazardous, chiefly nonmedicinal, chemicals: Secondary | ICD-10-CM

## 2021-05-03 DIAGNOSIS — X58XXXA Exposure to other specified factors, initial encounter: Secondary | ICD-10-CM | POA: Diagnosis not present

## 2021-05-03 DIAGNOSIS — H5712 Ocular pain, left eye: Secondary | ICD-10-CM | POA: Insufficient documentation

## 2021-05-03 MED ORDER — FLUORESCEIN SODIUM 1 MG OP STRP
1.0000 | ORAL_STRIP | Freq: Once | OPHTHALMIC | Status: AC
  Administered 2021-05-03: 1 via OPHTHALMIC
  Filled 2021-05-03: qty 1

## 2021-05-03 MED ORDER — TETRACAINE HCL 0.5 % OP SOLN
2.0000 [drp] | Freq: Once | OPHTHALMIC | Status: AC
  Administered 2021-05-03: 2 [drp] via OPHTHALMIC
  Filled 2021-05-03: qty 4

## 2021-05-03 NOTE — ED Provider Notes (Signed)
[facility] EMERGENCY DEPARTMENT Provider Note   CSN: [id number] Arrival date & time: 05/03/21  0003     History Chief Complaint  Patient presents with   Chemical Exposure    Left eye    Henry Fields is a 40 y.o. male.  Patient is a 39 year old male presenting with left eye chemical exposure.  He states he was cleaning a pool and poured bleach in it.  The bleach splashed up and got into his eye.  He has had burning and discomfort since.  This occurred at about 9 PM and he reports flushing the eye out copiously, but continues with irritation.  He denies any visual disturbances.  The history is provided by the patient.      Past Medical History:  Diagnosis Date   Kidney stones     There are no problems to display for this patient.   Past Surgical History:  Procedure Laterality Date   BACK SURGERY     LITHOTRIPSY         No family history on file.  Social History   Tobacco Use   Smoking status: Never   Smokeless tobacco: Never  Substance Use Topics   Alcohol use: No   Drug use: No    Home Medications Prior to Admission medications   Medication Sig Start Date End Date Taking? Authorizing Provider  dextromethorphan-guaiFENesin (MUCINEX DM) 30-600 MG 12hr tablet Take 1 tablet by mouth 2 (two) times daily. 11/27/16   Vanetta Mulders, MD  methocarbamol (ROBAXIN) 500 MG tablet Take 1 tablet (500 mg total) by mouth 2 (two) times daily. 02/05/20   Mannie Stabile, PA-C  methocarbamol (ROBAXIN) 750 MG tablet Take 1 tablet (750 mg total) by mouth 3 (three) times daily as needed (muscle spasm/pain). 01/15/19   Cathren Laine, MD  naproxen (NAPROSYN) 500 MG tablet Take 1 tablet (500 mg total) by mouth 2 (two) times daily. 11/27/16   Vanetta Mulders, MD  naproxen (NAPROSYN) 500 MG tablet Take 1 tablet (500 mg total) by mouth 2 (two) times daily. 02/05/20   Mannie Stabile, PA-C    Allergies    Patient has no known allergies.  Review of Systems   Review  of Systems  All other systems reviewed and are negative.  Physical Exam Updated Vital Signs BP 126/66 (BP Location: Left Arm)   Pulse (!) 55   Temp 98.1 F (36.7 C) (Oral)   Resp 18   Ht 5\' 10"  (1.778 m)   Wt 59 kg   SpO2 100%   BMI 18.65 kg/m   Physical Exam Vitals and nursing note reviewed.  Constitutional:      General: He is not in acute distress.    Appearance: Normal appearance. He is not ill-appearing.  HENT:     Head: Normocephalic and atraumatic.  Eyes:     Comments: The left conjunctiva is injected.  The cornea is clear to inspection and pupil is reactive.  Pulmonary:     Effort: Pulmonary effort is normal.  Skin:    General: Skin is warm and dry.  Neurological:     Mental Status: He is alert.    ED Results / Procedures / Treatments   Labs (all labs ordered are listed, but only abnormal results are displayed) Labs Reviewed - No data to display  EKG None  Radiology No results found.  Procedures Procedures   Medications Ordered in ED Medications  tetracaine (PONTOCAINE) 0.5 % ophthalmic solution 2 drop (has no administration in  time range)    ED Course  I have reviewed the triage vital signs and the nursing notes.  Pertinent labs & imaging results that were available during my care of the patient were reviewed by me and considered in my medical decision making (see chart for details).    MDM Rules/Calculators/A&P  Patient presenting here after splashing bleach in his eye.  He complains of eye irritation.  There is no obvious corneal abrasion to visual inspection or with fluorescein staining.  He flushed his eye out at home prior to coming here and then was irrigated again with 500 cc of normal saline.  Patient to be discharged with as needed follow-up and return  Final Clinical Impression(s) / ED Diagnoses Final diagnoses:  None    Rx / DC Orders ED Discharge Orders     None        Geoffery Lyons, MD 05/03/21 0221

## 2021-05-03 NOTE — ED Notes (Signed)
Left eye irrigated per provider order. Patient states seeing better with reduction of burning sensation

## 2021-05-03 NOTE — Discharge Instructions (Addendum)
Take ibuprofen 600 mg every 6 hours as needed for pain.  Follow-up with ophthalmology if symptoms are not improving in the next few days.  The contact information for Dr. Sherryll Burger has been provided in this discharge summary for you to call and make these arrangements.  Return to the emergency department in the meantime if you develop worsening pain, worsening vision, or other new and concerning symptoms.

## 2021-05-03 NOTE — ED Triage Notes (Signed)
C/o Clorox splash in left eye x 3 hrs ago , pt reports flushing eye with water and eye drops x 3 hrs with no relief

## 2021-08-21 ENCOUNTER — Other Ambulatory Visit: Payer: Self-pay

## 2021-08-21 DIAGNOSIS — R509 Fever, unspecified: Secondary | ICD-10-CM | POA: Diagnosis present

## 2021-08-21 DIAGNOSIS — U071 COVID-19: Secondary | ICD-10-CM | POA: Diagnosis not present

## 2021-08-21 DIAGNOSIS — R1030 Lower abdominal pain, unspecified: Secondary | ICD-10-CM | POA: Insufficient documentation

## 2021-08-21 DIAGNOSIS — E860 Dehydration: Secondary | ICD-10-CM | POA: Diagnosis not present

## 2021-08-21 DIAGNOSIS — R112 Nausea with vomiting, unspecified: Secondary | ICD-10-CM | POA: Insufficient documentation

## 2021-08-22 ENCOUNTER — Emergency Department (HOSPITAL_BASED_OUTPATIENT_CLINIC_OR_DEPARTMENT_OTHER): Payer: BLUE CROSS/BLUE SHIELD

## 2021-08-22 ENCOUNTER — Emergency Department (HOSPITAL_BASED_OUTPATIENT_CLINIC_OR_DEPARTMENT_OTHER)
Admission: EM | Admit: 2021-08-22 | Discharge: 2021-08-22 | Disposition: A | Discharge status: Home / Self Care | Payer: BLUE CROSS/BLUE SHIELD | Attending: Emergency Medicine | Admitting: Emergency Medicine

## 2021-08-22 ENCOUNTER — Encounter (HOSPITAL_BASED_OUTPATIENT_CLINIC_OR_DEPARTMENT_OTHER): Payer: Self-pay | Admitting: Emergency Medicine

## 2021-08-22 DIAGNOSIS — R112 Nausea with vomiting, unspecified: Secondary | ICD-10-CM

## 2021-08-22 DIAGNOSIS — U071 COVID-19: Secondary | ICD-10-CM

## 2021-08-22 LAB — CBC
HCT: 44.1 % (ref 39.0–52.0)
Hemoglobin: 15 g/dL (ref 13.0–17.0)
MCH: 30.9 pg (ref 26.0–34.0)
MCHC: 34 g/dL (ref 30.0–36.0)
MCV: 90.9 fL (ref 80.0–100.0)
Platelets: 230 10*3/uL (ref 150–400)
RBC: 4.85 MIL/uL (ref 4.22–5.81)
RDW: 12.6 % (ref 11.5–15.5)
WBC: 11.1 10*3/uL — ABNORMAL HIGH (ref 4.0–10.5)
nRBC: 0 % (ref 0.0–0.2)

## 2021-08-22 LAB — COMPREHENSIVE METABOLIC PANEL
ALT: 15 U/L (ref 0–44)
AST: 23 U/L (ref 15–41)
Albumin: 4.4 g/dL (ref 3.5–5.0)
Alkaline Phosphatase: 50 U/L (ref 38–126)
Anion gap: 11 (ref 5–15)
BUN: 12 mg/dL (ref 6–20)
CO2: 24 mmol/L (ref 22–32)
Calcium: 9.5 mg/dL (ref 8.9–10.3)
Chloride: 99 mmol/L (ref 98–111)
Creatinine, Ser: 0.85 mg/dL (ref 0.61–1.24)
GFR, Estimated: >60 mL/min (ref >60)
Glucose, Bld: 126 mg/dL — ABNORMAL HIGH (ref 70–99)
Potassium: 3.9 mmol/L (ref 3.5–5.1)
Sodium: 134 mmol/L — ABNORMAL LOW (ref 135–145)
Total Bilirubin: 0.4 mg/dL (ref 0.3–1.2)
Total Protein: 7.6 g/dL (ref 6.5–8.1)

## 2021-08-22 LAB — TROPONIN I (HIGH SENSITIVITY): Troponin I (High Sensitivity): 3 ng/L (ref <18)

## 2021-08-22 LAB — URINALYSIS, ROUTINE W REFLEX MICROSCOPIC
Bilirubin Urine: NEGATIVE
Glucose, UA: NEGATIVE mg/dL
Hgb urine dipstick: NEGATIVE
Ketones, ur: 40 mg/dL — AB
Leukocytes,Ua: NEGATIVE
Nitrite: NEGATIVE
Protein, ur: NEGATIVE mg/dL
Specific Gravity, Urine: >=1.03 (ref 1.005–1.030)
pH: 6 (ref 5.0–8.0)

## 2021-08-22 LAB — RESP PANEL BY RT-PCR (FLU A&B, COVID) ARPGX2
Influenza A by PCR: NEGATIVE
Influenza B by PCR: NEGATIVE
SARS Coronavirus 2 by RT PCR: POSITIVE — AB

## 2021-08-22 LAB — D-DIMER, QUANTITATIVE: D-Dimer, Quant: 0.38 ug/mL-FEU (ref 0.00–0.50)

## 2021-08-22 LAB — LIPASE, BLOOD: Lipase: 23 U/L (ref 11–51)

## 2021-08-22 MED ORDER — ONDANSETRON 4 MG PO TBDP
4.0000 mg | ORAL_TABLET | Freq: Once | ORAL | Status: AC | PRN
  Administered 2021-08-22: 4 mg via ORAL
  Filled 2021-08-22: qty 1

## 2021-08-22 MED ORDER — ALBUTEROL SULFATE HFA 108 (90 BASE) MCG/ACT IN AERS: 1.0000 | INHALATION_SPRAY | Freq: Four times a day (QID) | RESPIRATORY_TRACT | 0 refills | Status: AC | PRN

## 2021-08-22 MED ORDER — ONDANSETRON 4 MG PO TBDP: 4.0000 mg | ORAL_TABLET | Freq: Three times a day (TID) | ORAL | 0 refills | Status: AC | PRN

## 2021-08-22 MED ORDER — NIRMATRELVIR/RITONAVIR (PAXLOVID)TABLET: 3.0000 | ORAL_TABLET | Freq: Two times a day (BID) | ORAL | 0 refills | Status: AC

## 2021-08-22 NOTE — ED Triage Notes (Addendum)
Pt states he has had a fever at home since Tuesday and tonight he started having nausea, vomiting, and diarrhea  Pt states he feels weak and fell at home tonight  States he thinks he is dehydrated  Pt is c/o lower abd pain and pain in his knee

## 2021-08-22 NOTE — Discharge Instructions (Signed)
You were seen in the emergency room today with COVID-19 along with vomiting.  You will need to remain in quarantine per CDC recommendation.  I am prescribing Paxil of it to treat your infection and shorten her symptoms.  You develop any new or suddenly worsening symptoms please return to the emergency department for reevaluation.

## 2021-08-22 NOTE — ED Provider Notes (Signed)
Emergency Department Provider Note   I have reviewed the triage vital signs and the nursing notes.   HISTORY  Chief Complaint Emesis   HPI Henry Fields is a 40 y.o. male with past history reviewed below presents emergency department with fever along with nausea, vomiting, diarrhea.  He has been feeling associated body aches along with generalized weakness.  He does have some mild to moderate discomfort in the lower abdomen which is nonradiating.  Denies any dysuria, hesitancy, urgency.  He is having some headache along with mild sore throat in addition to some nasal congestion.  He is feeling some subjective dehydration symptoms.  Symptoms began in the past 48 hours.  No radiation of symptoms or other modifying factors.   Past Medical History:  Diagnosis Date   Kidney stones     There are no problems to display for this patient.   Past Surgical History:  Procedure Laterality Date   BACK SURGERY     LITHOTRIPSY      Allergies Patient has no known allergies.  Family History  Problem Relation Age of Onset   Diabetes Mother    Alcoholism Father     Social History Social History   Tobacco Use   Smoking status: Never   Smokeless tobacco: Never  Vaping Use   Vaping Use: Never used  Substance Use Topics   Alcohol use: No   Drug use: No    Review of Systems  Constitutional: Positive fever/chills Eyes: No visual changes. ENT: Mild sore throat. Cardiovascular: Denies chest pain. Respiratory: Denies shortness of breath. Positive cough and congestion.  Gastrointestinal: Positive lower abdominal pain.  Positive N/V and diarrhea.  No constipation. Genitourinary: Negative for dysuria. Musculoskeletal: Negative for back pain. Skin: Negative for rash. Neurological: Negative for focal weakness or numbness. Positive HA.   10-point ROS otherwise negative.  ____________________________________________   PHYSICAL EXAM:  VITAL SIGNS: ED Triage Vitals  Enc  Vitals Group     BP 08/22/21 0031 105/69     Pulse Rate 08/22/21 0031 (!) 48     Resp 08/22/21 0031 18     Temp 08/22/21 0031 97.9 F (36.6 C)     Temp Source 08/22/21 0031 Oral     SpO2 08/22/21 0031 98 %     Weight 08/22/21 0038 130 lb (59 kg)     Height 08/22/21 0038 5\' 10"  (1.778 m)   Constitutional: Alert and oriented. Well appearing and in no acute distress. Eyes: Conjunctivae are normal. Head: Atraumatic. Nose: Mild congestion/rhinnorhea. Mouth/Throat: Mucous membranes are moist.  Oropharynx non-erythematous. Neck: No stridor.   Cardiovascular: Normal rate, regular rhythm. Good peripheral circulation. Grossly normal heart sounds.   Respiratory: Normal respiratory effort.  No retractions. Lungs CTAB. Gastrointestinal: Soft and nontender. No distention.  Musculoskeletal: No lower extremity tenderness nor edema. No gross deformities of extremities. Neurologic:  Normal speech and language. No gross focal neurologic deficits are appreciated.  Skin:  Skin is warm, dry and intact. No rash noted.  ____________________________________________   LABS (all labs ordered are listed, but only abnormal results are displayed)  Labs Reviewed  RESP PANEL BY RT-PCR (FLU A&B, COVID) ARPGX2 - Abnormal; Notable for the following components:      Result Value   SARS Coronavirus 2 by RT PCR POSITIVE (*)    All other components within normal limits  COMPREHENSIVE METABOLIC PANEL - Abnormal; Notable for the following components:   Sodium 134 (*)    Glucose, Bld 126 (*)  All other components within normal limits  CBC - Abnormal; Notable for the following components:   WBC 11.1 (*)    All other components within normal limits  URINALYSIS, ROUTINE W REFLEX MICROSCOPIC - Abnormal; Notable for the following components:   Ketones, ur 40 (*)    All other components within normal limits  LIPASE, BLOOD  D-DIMER, QUANTITATIVE  TROPONIN I (HIGH SENSITIVITY)  TROPONIN I (HIGH SENSITIVITY)    ____________________________________________  EKG   EKG Interpretation  Date/Time:  Thursday August 22 2021 05:10:33 EDT Ventricular Rate:  53 PR Interval:  141 QRS Duration: 83 QT Interval:  416 QTC Calculation: 391 R Axis:   78 Text Interpretation: Sinus rhythm Consider left ventricular hypertrophy ST changes and T wave changes similar to 2017 tracing Confirmed by Alona Bene [phone]) on 08/22/2021 5:47:21 AM        ____________________________________________  RADIOLOGY  CXR reviewed. No acute findings. No infiltrate.   ____________________________________________   PROCEDURES  Procedure(s) performed:   Procedures  None  ____________________________________________   INITIAL IMPRESSION / ASSESSMENT AND PLAN / ED COURSE  Pertinent labs & imaging results that were available during my care of the patient were reviewed by me and considered in my medical decision making (see chart for details).   Patient presents emergency department for evaluation of flulike illness along with nausea/vomiting/diarrhea.  Abdomen is diffusely soft and nontender.  Doubt acute surgical process in the abdomen requiring advanced imaging in the emergency department on emergency basis.  Chest x-ray shows no infiltrate/effusion/pulmonary edema.  EKG interpreted by me and is reassuring.  Did obtain troponin along with D-dimer which is within normal limits.  Exceedingly low suspicion for ACS.  With negative D-dimer feel that PE is reasonably evaluated and negative.  COVID test has come back positive which does explain the patient's symptoms.  Discussed supportive care along with Paxlovid at home. Discussed quarantine recommendations and ED return precautions.  ____________________________________________  FINAL CLINICAL IMPRESSION(S) / ED DIAGNOSES  Final diagnoses:  COVID-19  Nausea and vomiting, unspecified vomiting type     MEDICATIONS GIVEN DURING THIS VISIT:  Medications   ondansetron (ZOFRAN-ODT) disintegrating tablet 4 mg (4 mg Oral Given 08/22/21 0045)     NEW OUTPATIENT MEDICATIONS STARTED DURING THIS VISIT:  Discharge Medication List as of 08/22/2021  7:11 AM     START taking these medications   Details  albuterol (VENTOLIN HFA) 108 (90 Base) MCG/ACT inhaler Inhale 1-2 puffs into the lungs every 6 (six) hours as needed for wheezing or shortness of breath., Starting Thu 08/22/2021, Normal    nirmatrelvir/ritonavir EUA (PAXLOVID) 20 x 150 MG & 10 x 100MG  TABS Take 3 tablets by mouth 2 (two) times daily for 5 days. Patient GFR is > 60. Take nirmatrelvir (150 mg) two tablets twice daily for 5 days and ritonavir (100 mg) one tablet twice daily for 5 days., Starting Thu 08/22/2021, Until Tue 08/27/2021, Normal    ondansetron (ZOFRAN ODT) 4 MG disintegrating tablet Take 1 tablet (4 mg total) by mouth every 8 (eight) hours as needed., Starting Thu 08/22/2021, Normal        Note:  This document was prepared using Dragon voice recognition software and may include unintentional dictation errors.  08/24/2021, MD, [facility] Emergency Medicine    Keturah Yerby, [facility], MD 08/24/21 [phone]

## 2021-09-25 ENCOUNTER — Emergency Department (HOSPITAL_BASED_OUTPATIENT_CLINIC_OR_DEPARTMENT_OTHER)
Admission: EM | Admit: 2021-09-25 | Discharge: 2021-09-25 | Disposition: A | Discharge status: Home / Self Care | Payer: BLUE CROSS/BLUE SHIELD | Attending: Emergency Medicine | Admitting: Emergency Medicine

## 2021-09-25 ENCOUNTER — Emergency Department (HOSPITAL_BASED_OUTPATIENT_CLINIC_OR_DEPARTMENT_OTHER): Payer: BLUE CROSS/BLUE SHIELD

## 2021-09-25 ENCOUNTER — Encounter (HOSPITAL_BASED_OUTPATIENT_CLINIC_OR_DEPARTMENT_OTHER): Payer: Self-pay | Admitting: *Deleted

## 2021-09-25 ENCOUNTER — Other Ambulatory Visit: Payer: Self-pay

## 2021-09-25 DIAGNOSIS — J111 Influenza due to unidentified influenza virus with other respiratory manifestations: Secondary | ICD-10-CM

## 2021-09-25 DIAGNOSIS — J101 Influenza due to other identified influenza virus with other respiratory manifestations: Secondary | ICD-10-CM | POA: Insufficient documentation

## 2021-09-25 DIAGNOSIS — H6121 Impacted cerumen, right ear: Secondary | ICD-10-CM | POA: Diagnosis not present

## 2021-09-25 DIAGNOSIS — Z20822 Contact with and (suspected) exposure to covid-19: Secondary | ICD-10-CM | POA: Insufficient documentation

## 2021-09-25 DIAGNOSIS — M791 Myalgia, unspecified site: Secondary | ICD-10-CM | POA: Diagnosis present

## 2021-09-25 LAB — RESP PANEL BY RT-PCR (FLU A&B, COVID) ARPGX2
Influenza A by PCR: POSITIVE — AB
Influenza B by PCR: NEGATIVE
SARS Coronavirus 2 by RT PCR: NEGATIVE

## 2021-09-25 MED ORDER — PREDNISONE 10 MG (21) PO TBPK: ORAL_TABLET | ORAL | 0 refills | Status: AC

## 2021-09-25 MED ORDER — PREDNISONE 50 MG PO TABS
60.0000 mg | ORAL_TABLET | Freq: Once | ORAL | Status: AC
  Administered 2021-09-25: 60 mg via ORAL
  Filled 2021-09-25: qty 1

## 2021-09-25 NOTE — ED Triage Notes (Addendum)
Flu exposure with sx x 3 days, fever body aches chills family at home with FLU A

## 2021-09-25 NOTE — ED Provider Notes (Signed)
[facility] EMERGENCY DEPARTMENT Provider Note  CSN: [id number] Arrival date & time: 09/25/21 2149    History Chief Complaint  Patient presents with   flu exposure   Generalized Body Aches    Henry Fields is a 40 y.o. 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 He has been taking numerous OTC meds with minimal improvement. Reports he is coughing up green and yellow sputum. Reports some trouble hearing out of his R ear.    Past Medical History:  Diagnosis Date   Kidney stones     Past Surgical History:  Procedure Laterality Date   BACK SURGERY     LITHOTRIPSY      Family History  Problem Relation Age of Onset   Diabetes Mother    Alcoholism Father     Social History   Tobacco Use   Smoking status: Never   Smokeless tobacco: Never  Vaping Use   Vaping Use: Never used  Substance Use Topics   Alcohol use: No   Drug use: No     Home Medications Prior to Admission medications   Medication Sig Start Date End Date Taking? Authorizing Provider  predniSONE (STERAPRED UNI-PAK 21 TAB) 10 MG (21) TBPK tablet 10mg  Tabs, 6 day taper. Use as directed 09/25/21  Yes 09/27/21, MD  albuterol (VENTOLIN HFA) 108 (90 Base) MCG/ACT inhaler Inhale 1-2 puffs into the lungs every 6 (six) hours as needed for wheezing or shortness of breath. 08/22/21   Long, 08/24/21, MD  dextromethorphan-guaiFENesin [facility] DM) 30-600 MG 12hr tablet Take 1 tablet by mouth 2 (two) times daily. 11/27/16   11/29/16, MD  methocarbamol (ROBAXIN) 500 MG tablet Take 1 tablet (500 mg total) by mouth 2 (two) times daily. 02/05/20   04/06/20, PA-C  naproxen (NAPROSYN) 500 MG tablet Take 1 tablet (500 mg total) by mouth 2 (two) times daily. 02/05/20   04/06/20, PA-C  ondansetron (ZOFRAN ODT) 4 MG disintegrating tablet Take 1 tablet (4 mg total) by mouth every  8 (eight) hours as needed. 08/22/21   Long, 08/24/21, MD     Allergies    Patient has no known allergies.   Review of Systems   Review of Systems A comprehensive review of systems was completed and negative except as noted in HPI.    Physical Exam BP 134/73   Pulse (!) 104   Temp 98.7 F (37.1 C)   Resp 18   Ht 5\' 10"  (1.778 m)   Wt 59 kg   SpO2 99%   BMI 18.65 kg/m   Physical Exam Vitals and nursing note reviewed.  Constitutional:      Appearance: Normal appearance.  HENT:     Head: Normocephalic and atraumatic.     Ears:     Comments: R TM obscured by cerumen, removed with an ear scoop, mild canal abrasion during removal, TM without signs of infection or perforation    Nose: Nose normal.     Mouth/Throat:     Mouth: Mucous membranes are moist.  Eyes:     Extraocular Movements: Extraocular movements intact.     Conjunctiva/sclera: Conjunctivae normal.  Cardiovascular:     Rate and Rhythm: Normal rate.  Pulmonary:     Effort: Pulmonary effort is normal.     Breath sounds: Normal breath sounds.  Abdominal:  General: Abdomen is flat.     Palpations: Abdomen is soft.     Tenderness: There is no abdominal tenderness.  Musculoskeletal:        General: No swelling. Normal range of motion.     Cervical back: Neck supple.  Skin:    General: Skin is warm and dry.  Neurological:     General: No focal deficit present.     Mental Status: He is alert.  Psychiatric:        Mood and Affect: Mood normal.     ED Results / Procedures / Treatments   Labs (all labs ordered are listed, but only abnormal results are displayed) Labs Reviewed  RESP PANEL BY RT-PCR (FLU A&B, COVID) ARPGX2 - Abnormal; Notable for the following components:      Result Value   Influenza A by PCR POSITIVE (*)    All other components within normal limits    EKG None   Radiology DG Chest 2 View  Result Date: 09/25/2021 CLINICAL DATA:  Cough and flu like symptoms for several days  EXAM: CHEST - 2 VIEW COMPARISON:  08/22/2021 FINDINGS: Cardiac shadow is within normal limits. Fixation rods are noted in the thoracolumbar spine. Lungs are clear. No acute bony abnormality is seen. IMPRESSION: No active cardiopulmonary disease. Electronically Signed   By: Alcide Clever M.D.   On: 09/25/2021 22:53    Procedures Procedures  Medications Ordered in the ED Medications  predniSONE (DELTASONE) tablet 60 mg (has no administration in time range)     MDM Rules/Calculators/A&P MDM  Patient with flu-like symptoms, ongoing for about a week. Had Covid about a month ago as well. Will check swab, CXR for signs of secondary pneumonia. Vitals are unremarkable.  ED Course  I have reviewed the triage vital signs and the nursing notes.  Pertinent labs & imaging results that were available during my care of the patient were reviewed by me and considered in my medical decision making (see chart for details).  Clinical Course as of 09/25/21 2301  Wed Sep 25, 2021  2254 Flu is positive. CXR appears clear to me, awaiting formal read. Patient is outside the window for any benefit from Tamiflu.  [CS]  2300 Patient will be discharged with recommendations to continue with outpatient symptomatic meds. May benefit from steroids give ongoing cough. PCP follow up.  [CS]    Clinical Course User Index [CS] Pollyann Savoy, MD    Final Clinical Impression(s) / ED Diagnoses Final diagnoses:  Influenza    Rx / DC Orders ED Discharge Orders          Ordered    predniSONE (STERAPRED UNI-PAK 21 TAB) 10 MG (21) TBPK tablet        09/25/21 2300             Pollyann Savoy, MD 09/25/21 [phone]
